# Patient Record
Sex: Female | Born: 1980 | Race: White | Hispanic: No | Marital: Married | Smoking: Never smoker
Health system: Southern US, Community
[De-identification: ages and names within clinical notes are randomized; demographics above are authoritative.]

## PROBLEM LIST (undated history)

## (undated) ENCOUNTER — Inpatient Hospital Stay (HOSPITAL_COMMUNITY): Payer: Self-pay

## (undated) DIAGNOSIS — D649 Anemia, unspecified: Secondary | ICD-10-CM

## (undated) DIAGNOSIS — O039 Complete or unspecified spontaneous abortion without complication: Secondary | ICD-10-CM

## (undated) DIAGNOSIS — E063 Autoimmune thyroiditis: Secondary | ICD-10-CM

## (undated) DIAGNOSIS — E039 Hypothyroidism, unspecified: Secondary | ICD-10-CM

## (undated) HISTORY — PX: ANKLE SURGERY: SHX546

## (undated) HISTORY — PX: TONSILLECTOMY: SUR1361

## (undated) HISTORY — PX: EYE SURGERY: SHX253

## (undated) HISTORY — PX: WISDOM TOOTH EXTRACTION: SHX21

---
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
## 2003-07-17 ENCOUNTER — Other Ambulatory Visit: Admission: RE | Admit: 2003-07-17 | Discharge: 2003-07-17 | Payer: Self-pay | Admitting: Obstetrics and Gynecology

## 2004-08-05 ENCOUNTER — Other Ambulatory Visit: Admission: RE | Admit: 2004-08-05 | Discharge: 2004-08-05 | Payer: Self-pay | Admitting: Obstetrics and Gynecology

## 2004-08-06 ENCOUNTER — Other Ambulatory Visit: Admission: RE | Admit: 2004-08-06 | Discharge: 2004-08-06 | Payer: Self-pay | Admitting: Obstetrics and Gynecology

## 2011-08-04 ENCOUNTER — Encounter (HOSPITAL_COMMUNITY): Payer: Self-pay

## 2011-08-04 ENCOUNTER — Ambulatory Visit (HOSPITAL_COMMUNITY)
Admission: RE | Admit: 2011-08-04 | Discharge: 2011-08-04 | Disposition: A | Discharge status: Home / Self Care | Payer: PRIVATE HEALTH INSURANCE | Source: Ambulatory Visit | Attending: Obstetrics and Gynecology | Admitting: Obstetrics and Gynecology

## 2011-08-04 NOTE — Progress Notes (Addendum)
Infant Lactation Consultation Outpatient Visit Note  SLOW WEIGHT GAIN  Patient Name: Bonnie Fisher  Infant- Bonnie Fisher Date of Birth: 05/06/1981  DOB- [birth year]  16 day old infant Birth Weight:  7 lbs. 2 oz  todays weight - 6 lbs. 11.7 oz Gestational Age at Delivery: Gestational Age: <None> 38 5/[redacted] weeks gestation Type of Delivery: Cesarean Section  Mom delivered at [facility] days ago, her second child.  Bonnie Fisher breast fed her first child (69 months old) for 6 months, until she returned to work and her supply dropped.  She states her baby grew well, no problems.  Bonnie Fisher lost 1 lb (16 oz) by the 5 th day of life.  Mom reports milk in, but nipples sore.  She said she fed every 3 hrs, waking baby as needed around the clock.  Bonnie Fisher fed for 30 mins or more.  Weight was checked 2 days later, and baby had gained 5.5 oz.  She fed on demand, continuing with the sore, blistered, cracked nipples, and a week later on 08/02/11, baby had gained .5 oz (6-8).  Pediatrician had her pump and bottle feed.  With her pump, she would obtain 1 1/2 -2 oz each pumping.  Yesterday she pumped every other feeding, and breast fed the other time.  Today, on assessment it is noted that baby latches too shallow on the breast.  Mom has baby resting in her lap, with little to no support of breast.  Baby flutter sucking, very little swallowing heard.  Assisted Mom to position baby more level of nipple, and support of breast, using breast compression to increase sucking and swallowing.  Baby fed for 20 mins with milk transfer at 16 ml.   After discussing options on how to increase Bonnie Fisher's weight, and support her milk supply, I initiated an SNS at the breast.  Baby was able to latch well, with assistance, and feed with rhythmic sucking and swallowing.  Bonnie Fisher reports no discomfort with this feeding.  Baby took entire 30 ml formula in the SNS plus more from Mom.  (baby had stooled and LC forgot to reweigh :-()  Mom very  pleased with plan.  Pump strength checked, and not up to proper limits, decision made to purchase a PIS pump (Free Style)  Breastfeeding History Frequency of Breastfeeding: every 3 hrs Length of Feeding: 30 mins Voids: 6-8/day Stools: 4-6 yellow seedy/day  Supplementing / Method: Pumping:  Type of Pump:  Medela PIS   Frequency:  Every 3 hrs Tuesday 08/02/11  Volume:  1 oz per pumping     Plan to feed baby at breast using SNS on second breast with 30 ml formula+/or EBM.  Mom to pump both breasts 15 mins.  If SNS not used at feeding, she is to follow breast feeding with 45 ml formula+/or EBM.  If she is too tired to breast feed, she can pump 20 mins both breasts, and bottle feed (slow flow) 60 ml EBM+/formula.  Return to Lactation Office on Monday, February 18th at 10:30am for another feeding assessment.      :           Bonnie Fisher 08/04/2011, 5:27 PM

## 2011-08-05 ENCOUNTER — Encounter (HOSPITAL_COMMUNITY): Payer: Self-pay

## 2011-08-08 ENCOUNTER — Ambulatory Visit (HOSPITAL_COMMUNITY)
Admission: RE | Admit: 2011-08-08 | Discharge: 2011-08-08 | Disposition: A | Discharge status: Home / Self Care | Payer: PRIVATE HEALTH INSURANCE | Source: Ambulatory Visit | Attending: Obstetrics and Gynecology | Admitting: Obstetrics and Gynecology

## 2011-08-09 NOTE — Progress Notes (Signed)
Adult Lactation Consultation Outpatient Visit Note  Patient Name: Bonnie Fisher       BABY: Luiz Ochoa Date of Birth: June 25, 1980                 DOB:  [birth year]   BIRTH WEIGHT: 7-2  WEIGHT TODAY 6-15.2 Gestational Age at Delivery: 38 5/7 Type of Delivery: C/S  Breastfeeding History: Frequency of Breastfeeding: EVERY 3 HOURS Length of Feeding: 15 MINUTES EACH BREAST Voids: 8/24 HOURS Stools: 8/24 HOURS  Supplementing / Method:Bottle/30-45 mls EBM every 3 hours after breastfeeding Pumping:  Type of Pump:PUMP IN STYLE   Frequency:EVERY 3 HOURS AFTER BREASTFEEDING  Volume:  30-45 MLS  Comments:    Consultation Evaluation:Mom is here for follow up appointment from 08/04/11 for slow weight gain and low milk supply.  She states she was unable to get SNS to work well at home so pumping after feeds and supplementing with bottle.  She states nipples are no longer sore and she feels like the baby is latching and feeding better.  Baby has gained 3.5 oz/3 days.  Observed mom latch baby without difficulty and latch deep into baby's mouth.  Baby nurses without much vigor and not a lot of audible swallows.  Mom reminded of good breast compression/massage to increase milk intake.  Total milk transfer after both breasts 36 mls.  Discussed plan with mother to continue pumping every three hours after feeds and supplementing with 30-22mls until milk supply has increased and baby's ability to transfer more milk has improved.  Information given on fenugreek and moringa for mom to consider to assist with milk supply.  Outpatient appointment scheduled for 1 week to assess weight and feeding.  Initial Feeding Assessment:RIGHT BREAST 15 MINUTES Pre-feed ZOXWRU:0454 Post-feed UJWJXB:1478 Amount Transferred:20 MLS Comments:  Additional Feeding Assessment:LEFT BREAST 15 MINUTES Pre-feed GNFAOZ:3086 Post-feed Weight:3190 Amount Transferred:16 MLS Comments:  Additional Feeding Assessment: Pre-feed  Weight: Post-feed Weight: Amount Transferred: Comments:  Total Breast milk Transferred this Visit: 36 MLS Total Supplement Given: 60 MLS EBM PER BOTTLE  Additional Interventions:   Follow-Up  Lactation appointment 08/15/11      Hansel Feinstein 08/09/2011, 3:44 PM

## 2011-08-15 ENCOUNTER — Ambulatory Visit (HOSPITAL_COMMUNITY)
Admission: RE | Admit: 2011-08-15 | Discharge: 2011-08-15 | Disposition: A | Discharge status: Home / Self Care | Payer: PRIVATE HEALTH INSURANCE | Source: Ambulatory Visit | Attending: Obstetrics and Gynecology | Admitting: Obstetrics and Gynecology

## 2011-08-15 NOTE — Progress Notes (Signed)
Adult Lactation Consultation Outpatient Visit Note  Patient Name: Bonnie Fisher                      Baby girl [name], DOB [birth year] 23 1/2 weeks old, Birth Weight 7 lb. 2 oz Date of Birth: [birth year]                                 Weight on [facility] at [facility] 08/10/11 was 7lb 1 oz.  Gestational Age at Delivery: Unknown Type of Delivery: c/s  Breastfeeding History: Frequency of Breastfeeding: every 3 hours Length of Feeding: 15 minutes Voids: 6-8/day Stools: was 4-6/day, past 2 days 1/day, yellow in color  Supplementing / Method: Pumping:  Type of Pump:  Medela Free Style   Frequency:  After each feeding, every 3 hours for 15 minutes  Volume:  Was getting 1 oz., now getting 1/2 to 1 oz. But mom feels baby is nursing better and emptying breast better. More active at the breast.  Comments: Follow up visit from last Monday, 08/08/11 for feeding assessment and weight check. Weight on 08/08/11 was 6 lb. 15 oz.  [name] had not regained to equal her birth weight. Mom is intermittently supplementing with 1/2 oz to 1 oz EBM. She reports she gives the baby back any amount of EBM she has pumped after the previous feeding. [name]'s weight today is 7lb 6 oz. Which indicates she has gained approx. An ounce per day. Mom reports she started Fenugreek on Saturday, 08/13/11 to support her milk supply.   Consultation Evaluation: [name] latched well to mom's left breast, assisted to bring bottom lip down. Lots of swallows audible with the feeding. [name] latched for 5 minutes to right breast with good swallows audible, then fell asleep. Very good feeding this time.    Initial Feeding Assessment: Pre-feed Weight:      7lb 6 oz.  3392gm. Post-feed Weight:     7 lb. 10.9 oz    3484 gm Amount Transferred:   92 ml  Breastfeeding for 20 minutes on left breast Comments:  Additional Feeding Assessment: Pre-feed Weight:     7 lb. 10.9 oz  3484 gm Post-feed Weight:    7 lb. 11.8 oz.  3510 gm Amount Transferred:   26 ml Breastfeeding for 5 minutes on right breast Comments:  Additional Feeding Assessment: Pre-feed Weight: Post-feed Weight: Amount Transferred: Comments:  Total Breast milk Transferred this Visit: 118 ml approx 4 oz. Total Supplement Given:   Additional Interventions: No further interventions needed. Mom was very pleased with weight gain and milk transfer at this feeding. She will continue to Breastfeed every 3 hours. She will let [name] wake her at night. She will post pump if needed or if [name] only takes one breast. Advised to supplement with EBM if [name] is lazy at the breast or does not feed well. Advised of support group. Discussed pumping to return to work.  Follow-Up  Prn    Alfred Levins 08/15/2011, 10:49 AM

## 2012-06-30 ENCOUNTER — Encounter (HOSPITAL_COMMUNITY): Payer: Self-pay | Admitting: *Deleted

## 2012-06-30 ENCOUNTER — Inpatient Hospital Stay (HOSPITAL_COMMUNITY)
Admission: AD | Admit: 2012-06-30 | Discharge: 2012-06-30 | Disposition: A | Discharge status: Home / Self Care | Payer: PRIVATE HEALTH INSURANCE | Source: Ambulatory Visit | Attending: Obstetrics & Gynecology | Admitting: Obstetrics & Gynecology

## 2012-06-30 ENCOUNTER — Inpatient Hospital Stay (HOSPITAL_COMMUNITY): Payer: PRIVATE HEALTH INSURANCE

## 2012-06-30 DIAGNOSIS — O209 Hemorrhage in early pregnancy, unspecified: Secondary | ICD-10-CM

## 2012-06-30 HISTORY — DX: Autoimmune thyroiditis: E06.3

## 2012-06-30 LAB — CBC
HCT: 35 % — ABNORMAL LOW (ref 36.0–46.0)
Hemoglobin: 11.7 g/dL — ABNORMAL LOW (ref 12.0–15.0)
MCH: 25.9 pg — ABNORMAL LOW (ref 26.0–34.0)
MCHC: 33.4 g/dL (ref 30.0–36.0)
MCV: 77.6 fL — ABNORMAL LOW (ref 78.0–100.0)
RBC: 4.51 MIL/uL (ref 3.87–5.11)

## 2012-06-30 LAB — URINALYSIS, ROUTINE W REFLEX MICROSCOPIC
Bilirubin Urine: NEGATIVE
Glucose, UA: NEGATIVE mg/dL
Ketones, ur: NEGATIVE mg/dL
Leukocytes, UA: NEGATIVE
Protein, ur: NEGATIVE mg/dL

## 2012-06-30 LAB — ABO/RH: ABO/RH(D): O POS

## 2012-06-30 LAB — URINE MICROSCOPIC-ADD ON

## 2012-06-30 NOTE — MAU Provider Note (Signed)
Attestation of Attending Supervision of Advanced Practitioner (PA/CNM/NP): Evaluation and management procedures were performed by the Advanced Practitioner under my supervision and collaboration.  I have reviewed the Advanced Practitioner's note and chart, and I agree with the management and plan.  Clella Mckeel, MD, FACOG Attending Obstetrician & Gynecologist Faculty Practice, [facility]  

## 2012-06-30 NOTE — MAU Provider Note (Signed)
Chief Complaint  Patient presents with  . Vaginal Bleeding    Subjective Bonnie Fisher 32 y.o.  G1P0 [redacted]w[redacted]d presents with onset today of first episode of small amount pink vaginal bleeding noted after wiping yesterday afternoon. She called her physician in [place] who reassured that that may be implantation spotting. Later yesterday evening she began to have heavier bleeding like a light period and has continued to have brown and red vaginal bleeding this morning. Minimal cramping. Not passing clots or tissue. No antecedent intercourse. Denies abnormal  vaginal discharge or irritation. No dysuria or hematuria.  Lives in [place], works [employer], plans [facility] in [place], [place]  Past Medical History  Diagnosis Date  . Hashimoto's disease      Objective   Filed Vitals:   06/30/12 1011  BP: 108/76  Pulse: 74  Temp: 98 F (36.7 C)  Resp: 18     Physical Exam General: WN/WD pleasant female in NAD  Abdomen: soft, NT Pelvic:External genitalia: normal; BUS neg             Spec: small amount dark red blood in vault, swabbed off and no active bleeding                      Cx nulliparous, no lesions, appears closed           Bimanual: Cx closed, long; no CMT                             Uterus anteverted, NT, retroverted                             Adnexae non tender, no masses   Lab Results Results for orders placed during the hospital encounter of 06/30/12 (from the past 24 hour(s))  URINALYSIS, ROUTINE W REFLEX MICROSCOPIC     Status: Abnormal   Collection Time   06/30/12 10:10 AM      Component Value Range   Color, Urine YELLOW  YELLOW   APPearance CLEAR  CLEAR   Specific Gravity, Urine 1.020  1.005 - 1.030   pH 8.0  5.0 - 8.0   Glucose, UA NEGATIVE  NEGATIVE mg/dL   Hgb urine dipstick LARGE (*) NEGATIVE   Bilirubin Urine NEGATIVE  NEGATIVE   Ketones, ur NEGATIVE  NEGATIVE mg/dL   Protein, ur NEGATIVE  NEGATIVE mg/dL   Urobilinogen, UA 0.2  0.0 - 1.0 mg/dL   Nitrite NEGATIVE  NEGATIVE     Leukocytes, UA NEGATIVE  NEGATIVE  URINE MICROSCOPIC-ADD ON     Status: Abnormal   Collection Time   06/30/12 10:10 AM      Component Value Range   Squamous Epithelial / LPF FEW (*) RARE   WBC, UA 3-6  <3 WBC/hpf   RBC / HPF 21-50  <3 RBC/hpf   Bacteria, UA FEW (*) RARE   Urine-Other MUCOUS PRESENT    POCT PREGNANCY, URINE     Status: Abnormal   Collection Time   06/30/12 10:24 AM      Component Value Range   Preg Test, Ur POSITIVE (*) NEGATIVE  WET PREP, GENITAL     Status: Abnormal   Collection Time   06/30/12 10:41 AM      Component Value Range   Yeast Wet Prep HPF POC NONE SEEN  NONE SEEN   Trich, Wet Prep NONE SEEN  NONE SEEN  Clue Cells Wet Prep HPF POC NONE SEEN  NONE SEEN   WBC, Wet Prep HPF POC MODERATE (*) NONE SEEN  HCG, QUANTITATIVE, PREGNANCY     Status: Abnormal   Collection Time   06/30/12 10:58 AM      Component Value Range   hCG, Beta Chain, Quant, S 836 (*) <5 mIU/mL  CBC     Status: Abnormal   Collection Time   06/30/12 10:59 AM      Component Value Range   WBC 5.8  4.0 - 10.5 K/uL   RBC 4.51  3.87 - 5.11 MIL/uL   Hemoglobin 11.7 (*) 12.0 - 15.0 g/dL   HCT 16.1 (*) 09.6 - 04.5 %   MCV 77.6 (*) 78.0 - 100.0 fL   MCH 25.9 (*) 26.0 - 34.0 pg   MCHC 33.4  30.0 - 36.0 g/dL   RDW 40.9  81.1 - 91.4 %   Platelets 252  150 - 400 K/uL  ABO/RH     Status: Normal   Collection Time   06/30/12 10:59 AM      Component Value Range   ABO/RH(D) O POS      Ultrasound US Ob Comp Less 14 Wks  06/30/2012  *RADIOLOGY REPORT*  Clinical Data: Vaginal bleeding.  Positive urine pregnancy test. Quantitative beta HCG 836.  OBSTETRIC <14 WK [place] AND TRANSVAGINAL OB US  Technique:  Both transabdominal and transvaginal ultrasound examinations were performed for complete evaluation of the gestation as well as the maternal uterus, adnexal regions, and pelvic cul-de-sac.  Transvaginal technique was performed to assess early pregnancy.  Comparison:  None.  Intrauterine gestational sac:   None visualized. Yolk sac: None. Embryo: None. Cardiac Activity: None. Heart Rate: Not applicable. bpm  MSD:  mm   w  d CRL:   mm   w   d             [place] EDC:  Maternal uterus/adnexae: The endometrium is mildly thickened but within normal limits for a woman of childbearing years, measuring 7 mm.  Tiny amount of fluid is present in the endometrium.  Left ovary demonstrates a small corpus luteum cyst.  Color flow to the ovaries is normal.  IMPRESSION: No intrauterine pregnancy identified.  Differential considerations include normal or abnormal early intrauterine pregnancy.  Ectopic cannot be excluded.  Consider follow-up 10-day ultrasound and correlation with serial quantitative beta HCGs.   Original Report Authenticated By: Andreas Newport, M.D.    US Ob Transvaginal  06/30/2012  *RADIOLOGY REPORT*  Clinical Data: Vaginal bleeding.  Positive urine pregnancy test. Quantitative beta HCG 836.  OBSTETRIC <14 WK [place] AND TRANSVAGINAL OB US  Technique:  Both transabdominal and transvaginal ultrasound examinations were performed for complete evaluation of the gestation as well as the maternal uterus, adnexal regions, and pelvic cul-de-sac.  Transvaginal technique was performed to assess early pregnancy.  Comparison:  None.  Intrauterine gestational sac:  None visualized. Yolk sac: None. Embryo: None. Cardiac Activity: None. Heart Rate: Not applicable. bpm  MSD:  mm   w  d CRL:   mm   w   d             [place] EDC:  Maternal uterus/adnexae: The endometrium is mildly thickened but within normal limits for a woman of childbearing years, measuring 7 mm.  Tiny amount of fluid is present in the endometrium.  Left ovary demonstrates a small corpus luteum cyst.  Color flow to the ovaries is normal.  IMPRESSION: No  intrauterine pregnancy identified.  Differential considerations include normal or abnormal early intrauterine pregnancy.  Ectopic cannot be excluded.  Consider follow-up 10-day ultrasound and correlation with serial  quantitative beta HCGs.   Original Report Authenticated By: Andreas Newport, M.D.      Assessment 32 yo primigravida at 7 wks by LMP  Early pregnancy bleeding, cannot R/O ectopic   Plan    GC/CT sent Likelihood of failed pregnancy discussed and possibility of very early IUP or ectopic reviewed with couple.  Discharge with AVS on Early Pregnancy Bleeding and Ectopic Pregnancy Followup with primary MD in [place] in 2 days  Matthan Sledge 06/30/2012 10:19 AM

## 2012-06-30 NOTE — MAU Note (Signed)
Pt reports she had some spotting yesterday.It got heavier last night filled up a pad may have had some clots. Now is back to spotting. Had a a little bit of back pain yesterday but no pain today.

## 2012-07-02 LAB — URINE CULTURE
Colony Count: 75000
Special Requests: NORMAL

## 2012-07-02 LAB — GC/CHLAMYDIA PROBE AMP: CT Probe RNA: NEGATIVE

## 2013-05-05 ENCOUNTER — Encounter (HOSPITAL_COMMUNITY): Payer: Self-pay | Admitting: *Deleted

## 2013-12-06 IMAGING — US US OB COMP LESS 14 WK
1 series · 14 of 28 positions shown · non-contrast
Comparison: None.

CLINICAL DATA: Vaginal bleeding.  Positive urine pregnancy test.
Quantitative beta HCG 836.

OBSTETRIC <14 WK US AND TRANSVAGINAL OB US
TECHNIQUE: Both transabdominal and transvaginal ultrasound
examinations were performed for complete evaluation of the
gestation as well as the maternal uterus, adnexal regions, and
pelvic cul-de-sac.  Transvaginal technique was performed to assess
early pregnancy.

[Series 1: us ob comp less 14 wks · 14 of 40 slices shown]
[im 2/40]
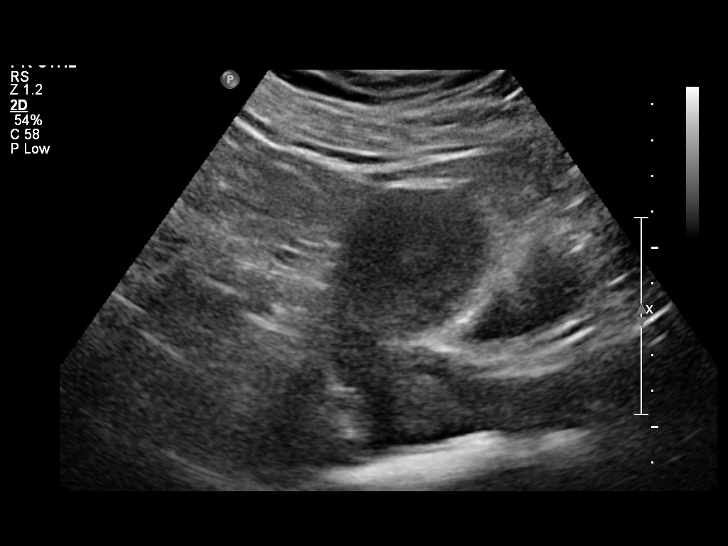
[im 5/40]
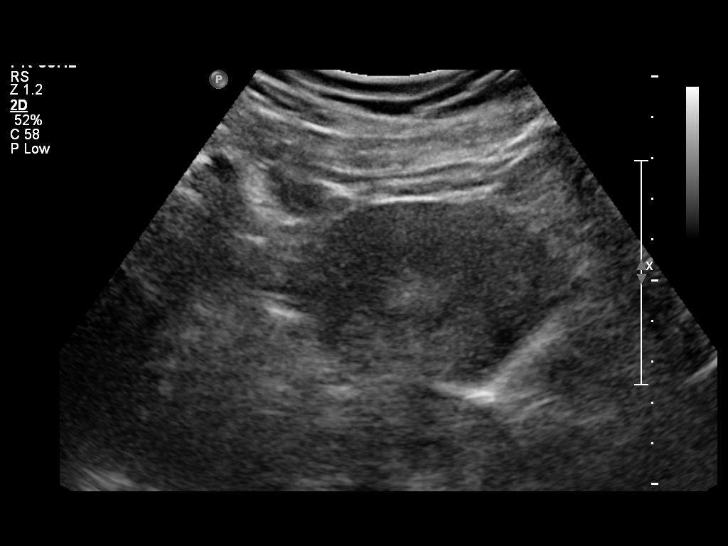
[im 8/40]
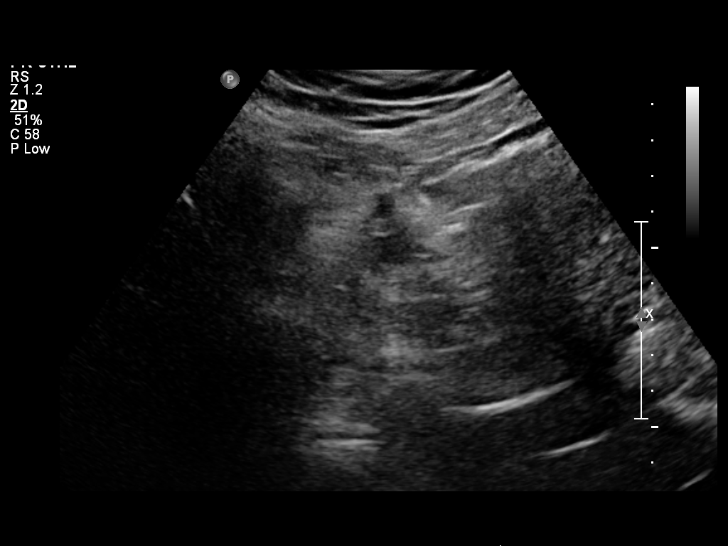
[im 11/40]
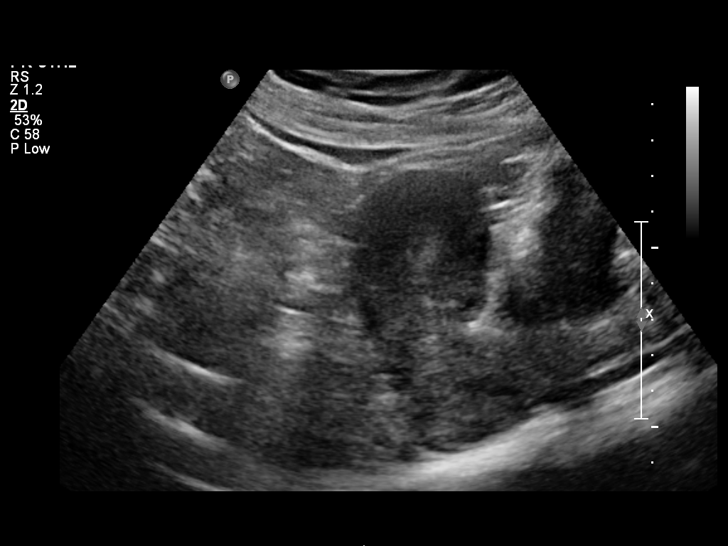
[im 14/40]
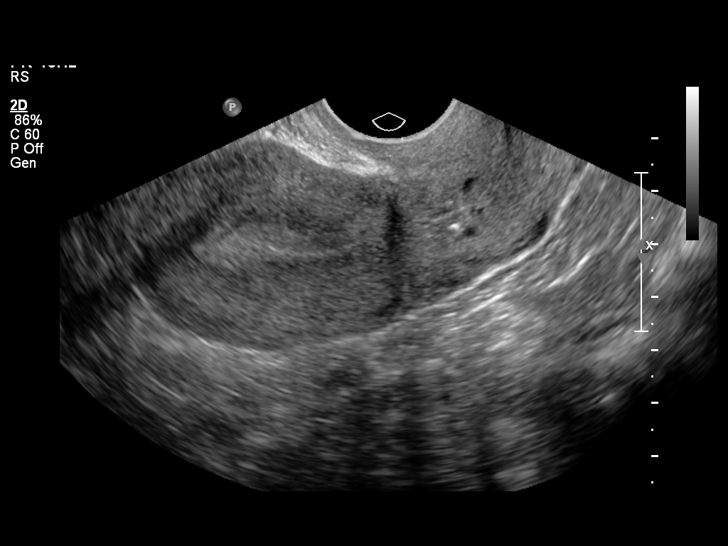
[im 16/40]
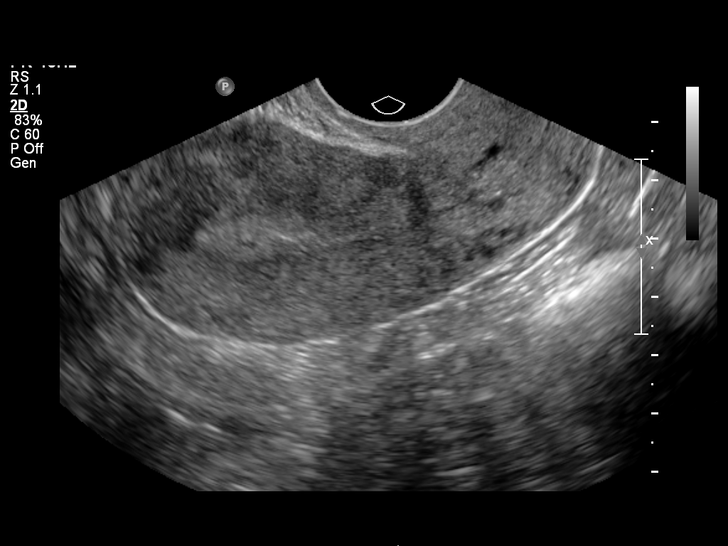
[im 19/40]
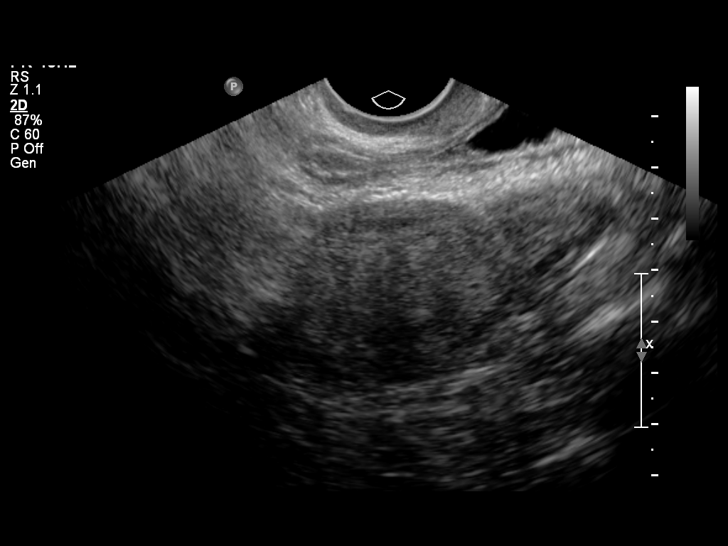
[im 22/40]
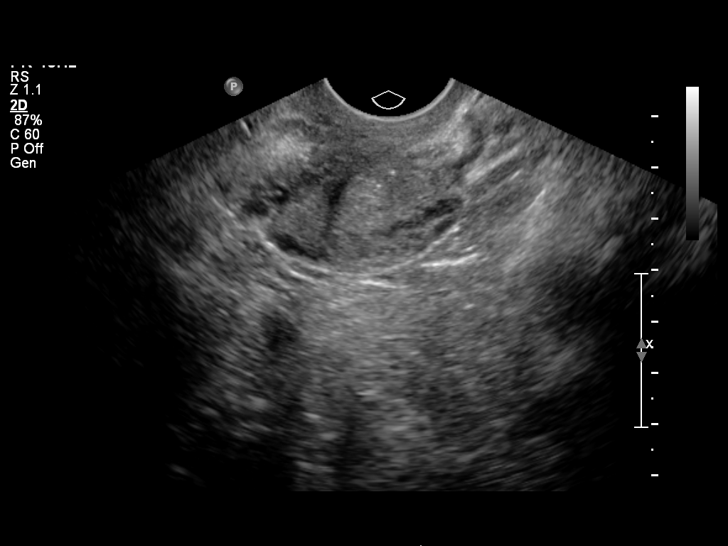
[im 25/40]
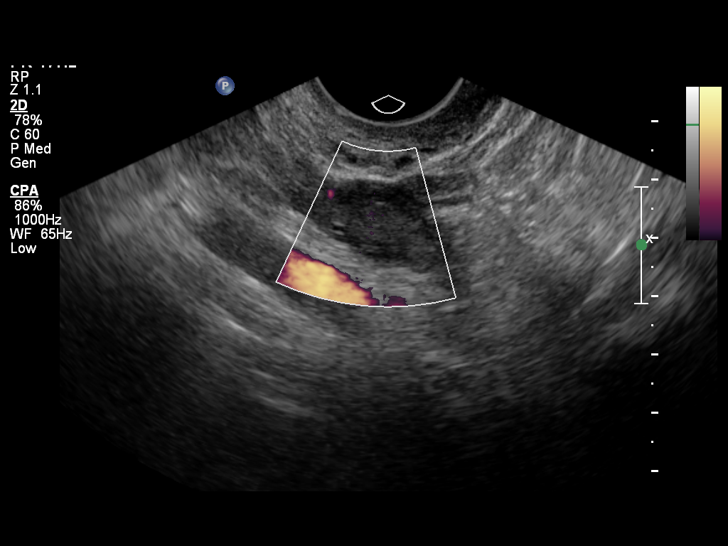
[im 28/40]
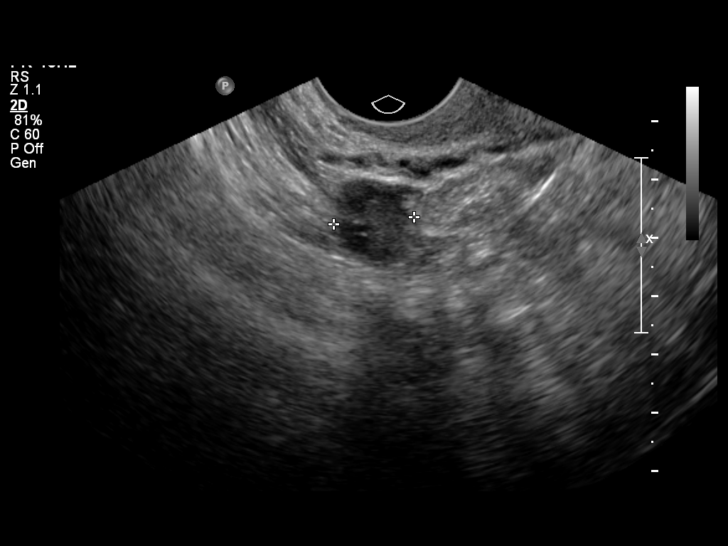
[im 31/40]
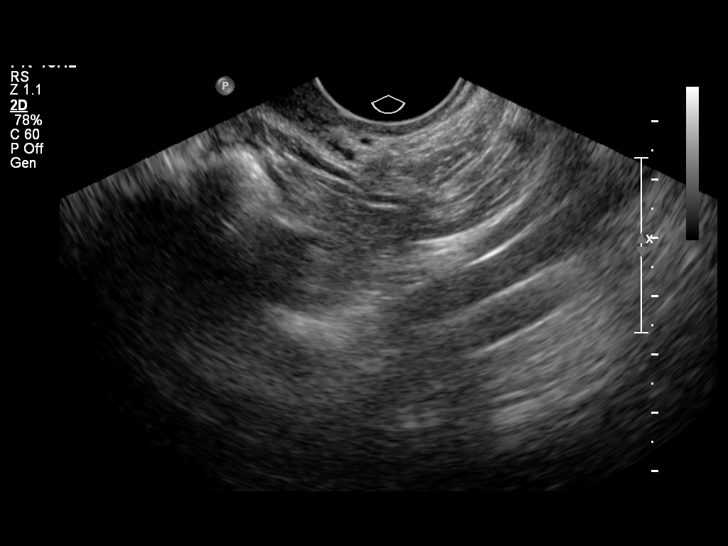
[im 34/40]
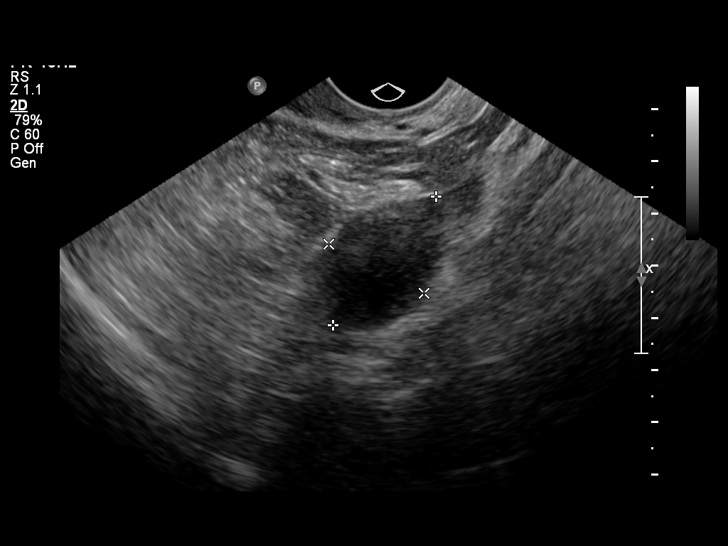
[im 37/40]
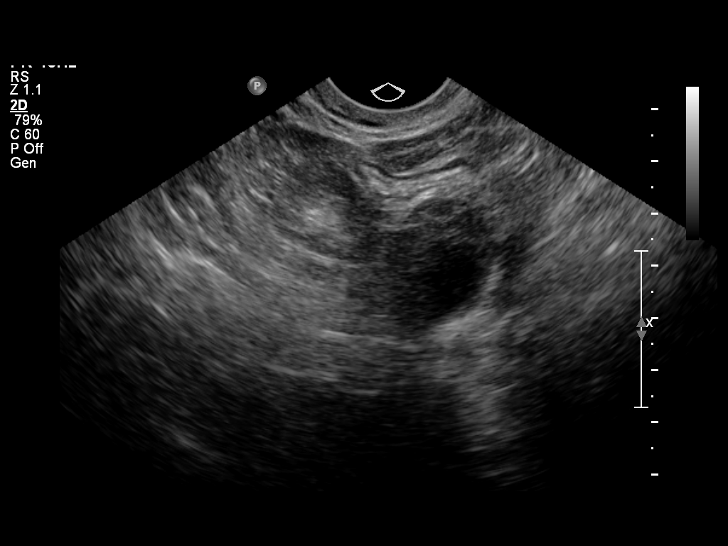
[im 40/40]
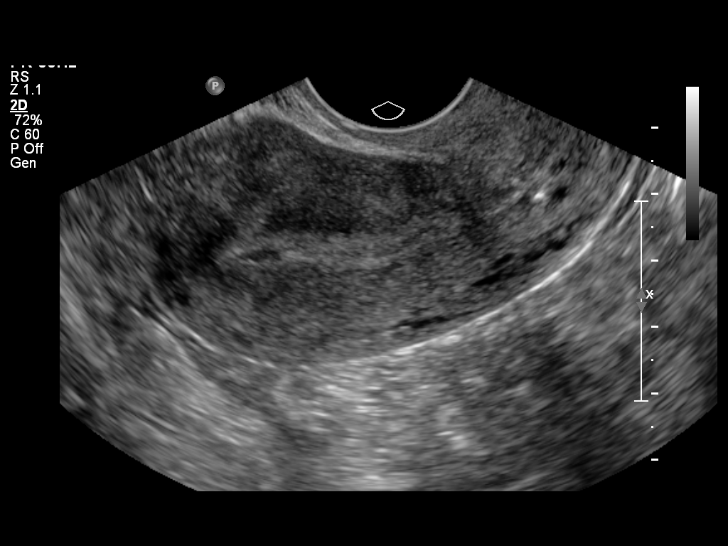

[14 of 28 positions shown; findings below may reference images not displayed]

Intrauterine gestational sac:  None visualized.
Yolk sac: None.
Embryo: None.
Cardiac Activity: None.
Heart Rate: Not applicable. bpm

MSD:  mm   w  d
CRL:   mm   w   d             US EDC:

Maternal uterus/adnexae:
The endometrium is mildly thickened but within normal limits for a
woman of childbearing years, measuring 7 mm.  Tiny amount of fluid
is present in the endometrium.  Left ovary demonstrates a small
corpus luteum cyst.  Color flow to the ovaries is normal.
IMPRESSION: No intrauterine pregnancy identified.  Differential considerations
include normal or abnormal early intrauterine pregnancy.  Ectopic
cannot be excluded.  Consider follow-up 10-day ultrasound and
correlation with serial quantitative beta HCGs.

## 2014-04-21 ENCOUNTER — Encounter (HOSPITAL_COMMUNITY): Payer: Self-pay | Admitting: *Deleted

## 2014-07-01 LAB — OB RESULTS CONSOLE HEPATITIS B SURFACE ANTIGEN: Hepatitis B Surface Ag: NEGATIVE

## 2014-07-01 LAB — OB RESULTS CONSOLE ABO/RH: RH Type: POSITIVE

## 2014-07-01 LAB — OB RESULTS CONSOLE ANTIBODY SCREEN: ANTIBODY SCREEN: NEGATIVE

## 2014-07-01 LAB — OB RESULTS CONSOLE HIV ANTIBODY (ROUTINE TESTING): HIV: NONREACTIVE

## 2014-10-10 LAB — OB RESULTS CONSOLE RPR: RPR: NONREACTIVE

## 2014-11-25 ENCOUNTER — Other Ambulatory Visit: Payer: Self-pay | Admitting: Obstetrics and Gynecology

## 2014-12-11 ENCOUNTER — Other Ambulatory Visit: Payer: Self-pay | Admitting: Obstetrics and Gynecology

## 2014-12-25 ENCOUNTER — Encounter (HOSPITAL_COMMUNITY): Payer: Self-pay

## 2014-12-25 NOTE — Patient Instructions (Addendum)
   Your procedure is scheduled on: MONDAY, JULY 11  Enter through the [facility]tr[facility] of [facility] at: 7:30 AM Pick up the phone at the desk and dial [phone] and inform us of your arrival.  Please call this number if you have any problems the morning of surgery: [phone]  Remember: Do not eat or drink after midnight: Sunday Take these medicines the morning of surgery with a SIP OF WATER: synthroid  Do not wear jewelry, make-up, or FINGER nail polish No metal in your hair or on your body. Do not wear lotions, powders, perfumes.  You may wear deodorant.  Do not bring valuables to the hospital. Contacts, dentures or bridgework may not be worn into surgery.  Leave suitcase in the car. After Surgery it may be brought to your room. For patients being admitted to the hospital, checkout time is 11:00am the day of discharge.  Home with husband Bucky cell [phone]

## 2014-12-26 ENCOUNTER — Encounter (HOSPITAL_COMMUNITY)
Admission: RE | Admit: 2014-12-26 | Discharge: 2014-12-26 | Disposition: A | Discharge status: Home / Self Care | Payer: BLUE CROSS/BLUE SHIELD | Source: Ambulatory Visit | Attending: Obstetrics and Gynecology | Admitting: Obstetrics and Gynecology

## 2014-12-26 ENCOUNTER — Encounter (HOSPITAL_COMMUNITY): Payer: Self-pay

## 2014-12-26 HISTORY — DX: Anemia, unspecified: D64.9

## 2014-12-26 HISTORY — DX: Complete or unspecified spontaneous abortion without complication: O03.9

## 2014-12-26 HISTORY — DX: Hypothyroidism, unspecified: E03.9

## 2014-12-26 LAB — CBC
HCT: 30.6 % — ABNORMAL LOW (ref 36.0–46.0)
Hemoglobin: 9.6 g/dL — ABNORMAL LOW (ref 12.0–15.0)
MCH: 23.8 pg — AB (ref 26.0–34.0)
MCHC: 31.4 g/dL (ref 30.0–36.0)
MCV: 75.7 fL — ABNORMAL LOW (ref 78.0–100.0)
PLATELETS: 241 10*3/uL (ref 150–400)
RBC: 4.04 MIL/uL (ref 3.87–5.11)
RDW: 14.7 % (ref 11.5–15.5)
WBC: 9.8 10*3/uL (ref 4.0–10.5)

## 2014-12-26 LAB — RPR: RPR Ser Ql: NONREACTIVE

## 2014-12-28 NOTE — H&P (Signed)
Bonnie Fisher is a 34 y.o. female presenting for rpt csection and TL.  Maternal Medical History:  Contractions: Onset was less than 1 hour ago.   Frequency: rare.   Perceived severity is mild.    Fetal activity: Perceived fetal activity is normal.   Last perceived fetal movement was within the past hour.    Prenatal complications: no prenatal complications Prenatal Complications - Diabetes: none.    OB History    Gravida Para Term Preterm AB TAB SAB Ectopic Multiple Living   5 3 3  0 1 0 1 0 0 3     Past Medical History  Diagnosis Date  . Hashimoto's disease     thyroid -autoimmune disease  . SAB (spontaneous abortion)     x 1 - no surgery required  . Hypothyroidism   . Anemia    Past Surgical History  Procedure Laterality Date  . Cesarean section      x 3  . Ankle surgery      left  . Tonsillectomy    . Wisdom tooth extraction    . Eye surgery      bilateral - lasik   Family History: family history includes Cancer in her father; Hyperlipidemia in her mother. Social History:  reports that she has never smoked. She has never used smokeless tobacco. She reports that she does not drink alcohol or use illicit drugs.   Prenatal Transfer Tool  Maternal Diabetes: No Genetic Screening: Normal Maternal Ultrasounds/Referrals: Normal Fetal Ultrasounds or other Referrals:  None Maternal Substance Abuse:  No Significant Maternal Medications:  None Significant Maternal Lab Results:  None Other Comments:  None  Review of Systems  Constitutional: Negative.   Gastrointestinal: Negative.   Genitourinary: Negative.   Neurological: Negative.   All other systems reviewed and are negative.     unknown if currently breastfeeding. Maternal Exam:  Uterine Assessment: Contraction strength is mild.  Contraction frequency is rare.   Abdomen: Patient reports no abdominal tenderness. Surgical scars: low transverse.   Fetal presentation: vertex  Introitus: Normal vulva. Normal  vagina.  Ferning test: not done.  Nitrazine test: not done. Amniotic fluid character: not assessed.  Pelvis: of concern for delivery.   Cervix: Cervix evaluated by digital exam.     Physical Exam  Nursing note and vitals reviewed. Constitutional: She is oriented to person, place, and time. She appears well-developed and well-nourished.  HENT:  Head: Normocephalic and atraumatic.  Neck: Normal range of motion. Neck supple.  Cardiovascular: Normal rate, regular rhythm and normal heart sounds.   Respiratory: Effort normal and breath sounds normal.  GI: Soft. Bowel sounds are normal.  Genitourinary: Vagina normal and uterus normal.  Musculoskeletal: Normal range of motion.  Neurological: She is alert and oriented to person, place, and time. She has normal reflexes.  Skin: Skin is dry.  Psychiatric: She has a normal mood and affect.    Prenatal labs: ABO, Rh: --/--/O POS (07/08 0845) Antibody: NEG (07/08 0845) Rubella:   RPR: Non Reactive (07/08 0845)  HBsAg: Negative (01/12 0000)  HIV: Non-reactive (01/12 0000)  GBS:     Assessment/Plan: Previous csection x 3 For rpt csection and TL Risks discussed. Consent done.   Maitri Schnoebelen J 12/28/2014, 5:32 PM

## 2014-12-28 NOTE — Anesthesia Preprocedure Evaluation (Addendum)
Anesthesia Evaluation  Patient identified by MRN, date of birth, ID band Patient awake    Reviewed: Allergy & Precautions, NPO status , Patient's Chart, lab work & pertinent test results  History of Anesthesia Complications Negative for: history of anesthetic complications  Airway Mallampati: II  TM Distance: >3 FB Neck ROM: Full    Dental no notable dental hx. (+) Dental Advisory Given   Pulmonary neg pulmonary ROS,  breath sounds clear to auscultation  Pulmonary exam normal       Cardiovascular negative cardio ROS Normal cardiovascular examRhythm:Regular Rate:Normal     Neuro/Psych negative neurological ROS  negative psych ROS   GI/Hepatic negative GI ROS, Neg liver ROS,   Endo/Other  Hypothyroidism obesity  Renal/GU negative Renal ROS  negative genitourinary   Musculoskeletal negative musculoskeletal ROS (+)   Abdominal   Peds negative pediatric ROS (+)  Hematology  (+) anemia ,   Anesthesia Other Findings   Reproductive/Obstetrics (+) Pregnancy                             Anesthesia Physical Anesthesia Plan  ASA: II  Anesthesia Plan: Spinal   Post-op Pain Management:    Induction:   Airway Management Planned:   Additional Equipment:   Intra-op Plan:   Post-operative Plan:   Informed Consent: I have reviewed the patients History and Physical, chart, labs and discussed the procedure including the risks, benefits and alternatives for the proposed anesthesia with the patient or authorized representative who has indicated his/her understanding and acceptance.   Dental advisory given  Plan Discussed with: CRNA  Anesthesia Plan Comments:         Anesthesia Quick Evaluation

## 2014-12-29 ENCOUNTER — Inpatient Hospital Stay (HOSPITAL_COMMUNITY): Payer: BLUE CROSS/BLUE SHIELD | Admitting: Anesthesiology

## 2014-12-29 ENCOUNTER — Inpatient Hospital Stay (HOSPITAL_COMMUNITY)
Admission: RE | Admit: 2014-12-29 | Discharge: 2014-12-31 | DRG: 765 | Disposition: A | Discharge status: Home / Self Care | Payer: BLUE CROSS/BLUE SHIELD | Source: Ambulatory Visit | Attending: Obstetrics and Gynecology | Admitting: Obstetrics and Gynecology

## 2014-12-29 ENCOUNTER — Encounter (HOSPITAL_COMMUNITY)
Admission: RE | Disposition: A | Discharge status: Home / Self Care | Payer: Self-pay | Source: Ambulatory Visit | Attending: Obstetrics and Gynecology

## 2014-12-29 ENCOUNTER — Encounter (HOSPITAL_COMMUNITY): Payer: Self-pay

## 2014-12-29 DIAGNOSIS — O99214 Obesity complicating childbirth: Secondary | ICD-10-CM | POA: Diagnosis present

## 2014-12-29 DIAGNOSIS — O09299 Supervision of pregnancy with other poor reproductive or obstetric history, unspecified trimester: Secondary | ICD-10-CM | POA: Diagnosis present

## 2014-12-29 DIAGNOSIS — Z302 Encounter for sterilization: Secondary | ICD-10-CM

## 2014-12-29 DIAGNOSIS — O99284 Endocrine, nutritional and metabolic diseases complicating childbirth: Secondary | ICD-10-CM | POA: Diagnosis present

## 2014-12-29 DIAGNOSIS — Z283 Underimmunization status: Secondary | ICD-10-CM

## 2014-12-29 DIAGNOSIS — E063 Autoimmune thyroiditis: Secondary | ICD-10-CM | POA: Diagnosis present

## 2014-12-29 DIAGNOSIS — O09523 Supervision of elderly multigravida, third trimester: Secondary | ICD-10-CM | POA: Diagnosis not present

## 2014-12-29 DIAGNOSIS — O99892 Other specified diseases and conditions complicating childbirth: Secondary | ICD-10-CM

## 2014-12-29 DIAGNOSIS — O3421 Maternal care for scar from previous cesarean delivery: Secondary | ICD-10-CM | POA: Diagnosis present

## 2014-12-29 DIAGNOSIS — D62 Acute posthemorrhagic anemia: Secondary | ICD-10-CM | POA: Diagnosis present

## 2014-12-29 DIAGNOSIS — O9902 Anemia complicating childbirth: Secondary | ICD-10-CM | POA: Diagnosis present

## 2014-12-29 DIAGNOSIS — D509 Iron deficiency anemia, unspecified: Secondary | ICD-10-CM | POA: Diagnosis present

## 2014-12-29 DIAGNOSIS — O99820 Streptococcus B carrier state complicating pregnancy: Secondary | ICD-10-CM | POA: Diagnosis present

## 2014-12-29 DIAGNOSIS — O99019 Anemia complicating pregnancy, unspecified trimester: Secondary | ICD-10-CM

## 2014-12-29 DIAGNOSIS — Z3A39 39 weeks gestation of pregnancy: Secondary | ICD-10-CM

## 2014-12-29 DIAGNOSIS — E039 Hypothyroidism, unspecified: Secondary | ICD-10-CM | POA: Diagnosis present

## 2014-12-29 DIAGNOSIS — O9989 Other specified diseases and conditions complicating pregnancy, childbirth and the puerperium: Secondary | ICD-10-CM

## 2014-12-29 LAB — PREPARE RBC (CROSSMATCH)

## 2014-12-29 SURGERY — Surgical Case
Anesthesia: Spinal | Laterality: Bilateral

## 2014-12-29 MED ORDER — SCOPOLAMINE 1 MG/3DAYS TD PT72
1.0000 | MEDICATED_PATCH | Freq: Once | TRANSDERMAL | Status: DC
  Filled 2014-12-29: qty 1

## 2014-12-29 MED ORDER — LACTATED RINGERS IV SOLN
40.0000 [IU] | INTRAVENOUS | Status: DC | PRN
  Administered 2014-12-29: 40 [IU] via INTRAVENOUS

## 2014-12-29 MED ORDER — OXYTOCIN 10 UNIT/ML IJ SOLN
INTRAMUSCULAR | Status: AC
  Filled 2014-12-29: qty 4

## 2014-12-29 MED ORDER — PHENYLEPHRINE 8 MG IN D5W 100 ML (0.08MG/ML) PREMIX OPTIME
INJECTION | INTRAVENOUS | Status: DC | PRN
  Administered 2014-12-29: 60 ug/min via INTRAVENOUS

## 2014-12-29 MED ORDER — LACTATED RINGERS IV SOLN
Freq: Once | INTRAVENOUS | Status: AC
  Administered 2014-12-29: 08:00:00 via INTRAVENOUS

## 2014-12-29 MED ORDER — MORPHINE SULFATE 0.5 MG/ML IJ SOLN
INTRAMUSCULAR | Status: AC
  Filled 2014-12-29: qty 10

## 2014-12-29 MED ORDER — SCOPOLAMINE 1 MG/3DAYS TD PT72
1.0000 | MEDICATED_PATCH | Freq: Once | TRANSDERMAL | Status: DC
  Administered 2014-12-29: 1.5 mg via TRANSDERMAL

## 2014-12-29 MED ORDER — DIPHENHYDRAMINE HCL 25 MG PO CAPS: 25.0000 mg | ORAL_CAPSULE | ORAL | Status: DC | PRN

## 2014-12-29 MED ORDER — BUPIVACAINE HCL (PF) 0.25 % IJ SOLN
INTRAMUSCULAR | Status: DC | PRN
  Administered 2014-12-29: 20 mL

## 2014-12-29 MED ORDER — DEXAMETHASONE SODIUM PHOSPHATE 4 MG/ML IJ SOLN
INTRAMUSCULAR | Status: AC
  Filled 2014-12-29: qty 1

## 2014-12-29 MED ORDER — WITCH HAZEL-GLYCERIN EX PADS: 1.0000 "application " | MEDICATED_PAD | CUTANEOUS | Status: DC | PRN

## 2014-12-29 MED ORDER — OXYTOCIN 40 UNITS IN LACTATED RINGERS INFUSION - SIMPLE MED: 62.5000 mL/h | INTRAVENOUS | Status: AC

## 2014-12-29 MED ORDER — SIMETHICONE 80 MG PO CHEW
80.0000 mg | CHEWABLE_TABLET | ORAL | Status: DC
Start: 2014-12-30 — End: 2014-12-31
  Administered 2014-12-29 – 2014-12-30 (×2): 80 mg via ORAL
  Filled 2014-12-29 (×2): qty 1

## 2014-12-29 MED ORDER — NALOXONE HCL 0.4 MG/ML IJ SOLN: 0.4000 mg | INTRAMUSCULAR | Status: DC | PRN

## 2014-12-29 MED ORDER — IBUPROFEN 600 MG PO TABS
600.0000 mg | ORAL_TABLET | Freq: Four times a day (QID) | ORAL | Status: DC
  Administered 2014-12-29 – 2014-12-31 (×7): 600 mg via ORAL
  Filled 2014-12-29 (×7): qty 1

## 2014-12-29 MED ORDER — ONDANSETRON HCL 4 MG/2ML IJ SOLN
INTRAMUSCULAR | Status: DC | PRN
  Administered 2014-12-29: 4 mg via INTRAVENOUS

## 2014-12-29 MED ORDER — LANOLIN HYDROUS EX OINT: 1.0000 "application " | TOPICAL_OINTMENT | CUTANEOUS | Status: DC | PRN

## 2014-12-29 MED ORDER — LACTATED RINGERS IV SOLN
INTRAVENOUS | Status: DC
  Administered 2014-12-29 (×2): via INTRAVENOUS

## 2014-12-29 MED ORDER — PRENATAL MULTIVITAMIN CH
1.0000 | ORAL_TABLET | Freq: Every day | ORAL | Status: DC
  Administered 2014-12-30: 1 via ORAL
  Filled 2014-12-29: qty 1

## 2014-12-29 MED ORDER — FENTANYL CITRATE (PF) 100 MCG/2ML IJ SOLN
INTRAMUSCULAR | Status: AC
  Filled 2014-12-29: qty 2

## 2014-12-29 MED ORDER — FENTANYL CITRATE (PF) 100 MCG/2ML IJ SOLN: 25.0000 ug | INTRAMUSCULAR | Status: DC | PRN

## 2014-12-29 MED ORDER — NALBUPHINE HCL 10 MG/ML IJ SOLN
INTRAMUSCULAR | Status: AC
  Administered 2014-12-29: 5 mg via SUBCUTANEOUS
  Filled 2014-12-29: qty 1

## 2014-12-29 MED ORDER — FENTANYL CITRATE (PF) 100 MCG/2ML IJ SOLN
INTRAMUSCULAR | Status: DC | PRN
  Administered 2014-12-29: 10 ug via INTRAVENOUS

## 2014-12-29 MED ORDER — SIMETHICONE 80 MG PO CHEW: 80.0000 mg | CHEWABLE_TABLET | ORAL | Status: DC | PRN

## 2014-12-29 MED ORDER — OXYCODONE-ACETAMINOPHEN 5-325 MG PO TABS
1.0000 | ORAL_TABLET | ORAL | Status: DC | PRN
  Administered 2014-12-31: 1 via ORAL
  Filled 2014-12-29: qty 1

## 2014-12-29 MED ORDER — ACETAMINOPHEN 325 MG PO TABS
650.0000 mg | ORAL_TABLET | ORAL | Status: DC | PRN
  Administered 2014-12-30 – 2014-12-31 (×2): 650 mg via ORAL
  Filled 2014-12-29 (×2): qty 2

## 2014-12-29 MED ORDER — DIPHENHYDRAMINE HCL 50 MG/ML IJ SOLN
12.5000 mg | INTRAMUSCULAR | Status: DC | PRN
  Administered 2014-12-29: 12.5 mg via INTRAVENOUS
  Filled 2014-12-29: qty 1

## 2014-12-29 MED ORDER — METHYLERGONOVINE MALEATE 0.2 MG/ML IJ SOLN: 0.2000 mg | INTRAMUSCULAR | Status: DC | PRN

## 2014-12-29 MED ORDER — TETANUS-DIPHTH-ACELL PERTUSSIS 5-2.5-18.5 LF-MCG/0.5 IM SUSP
0.5000 mL | Freq: Once | INTRAMUSCULAR | Status: AC
  Administered 2014-12-30: 0.5 mL via INTRAMUSCULAR
  Filled 2014-12-29: qty 0.5

## 2014-12-29 MED ORDER — SIMETHICONE 80 MG PO CHEW
80.0000 mg | CHEWABLE_TABLET | Freq: Three times a day (TID) | ORAL | Status: DC
  Administered 2014-12-29 – 2014-12-31 (×5): 80 mg via ORAL
  Filled 2014-12-29 (×5): qty 1

## 2014-12-29 MED ORDER — ONDANSETRON HCL 4 MG/2ML IJ SOLN
INTRAMUSCULAR | Status: AC
  Filled 2014-12-29: qty 2

## 2014-12-29 MED ORDER — KETOROLAC TROMETHAMINE 30 MG/ML IJ SOLN
INTRAMUSCULAR | Status: AC
  Filled 2014-12-29: qty 1

## 2014-12-29 MED ORDER — LACTATED RINGERS IV SOLN
INTRAVENOUS | Status: DC
Start: 2014-12-29 — End: 2014-12-31
  Administered 2014-12-29: 20:00:00 via INTRAVENOUS

## 2014-12-29 MED ORDER — SENNOSIDES-DOCUSATE SODIUM 8.6-50 MG PO TABS
2.0000 | ORAL_TABLET | ORAL | Status: DC
  Administered 2014-12-29 – 2014-12-30 (×2): 2 via ORAL
  Filled 2014-12-29 (×2): qty 2

## 2014-12-29 MED ORDER — ONDANSETRON HCL 4 MG/2ML IJ SOLN: 4.0000 mg | Freq: Once | INTRAMUSCULAR | Status: DC | PRN

## 2014-12-29 MED ORDER — MEPERIDINE HCL 25 MG/ML IJ SOLN
6.2500 mg | INTRAMUSCULAR | Status: DC | PRN
Start: 2014-12-29 — End: 2014-12-29

## 2014-12-29 MED ORDER — MENTHOL 3 MG MT LOZG: 1.0000 | LOZENGE | OROMUCOSAL | Status: DC | PRN

## 2014-12-29 MED ORDER — BUPIVACAINE LIPOSOME 1.3 % IJ SUSP
20.0000 mL | Freq: Once | INTRAMUSCULAR | Status: AC
  Administered 2014-12-29: 20 mL
  Filled 2014-12-29: qty 20

## 2014-12-29 MED ORDER — 0.9 % SODIUM CHLORIDE (POUR BTL) OPTIME
TOPICAL | Status: DC | PRN
  Administered 2014-12-29: 1000 mL

## 2014-12-29 MED ORDER — LEVOTHYROXINE SODIUM 25 MCG PO TABS
25.0000 ug | ORAL_TABLET | Freq: Every day | ORAL | Status: DC
  Administered 2014-12-30 – 2014-12-31 (×2): 25 ug via ORAL
  Filled 2014-12-29 (×2): qty 1

## 2014-12-29 MED ORDER — DIPHENHYDRAMINE HCL 25 MG PO CAPS: 25.0000 mg | ORAL_CAPSULE | Freq: Four times a day (QID) | ORAL | Status: DC | PRN

## 2014-12-29 MED ORDER — CEFAZOLIN SODIUM-DEXTROSE 2-3 GM-% IV SOLR
2.0000 g | INTRAVENOUS | Status: AC
  Administered 2014-12-29: 2 g via INTRAVENOUS

## 2014-12-29 MED ORDER — SODIUM CHLORIDE 0.9 % IJ SOLN
INTRAMUSCULAR | Status: AC
  Filled 2014-12-29: qty 50

## 2014-12-29 MED ORDER — KETOROLAC TROMETHAMINE 30 MG/ML IJ SOLN: 30.0000 mg | Freq: Four times a day (QID) | INTRAMUSCULAR | Status: DC | PRN

## 2014-12-29 MED ORDER — NALOXONE HCL 1 MG/ML IJ SOLN
1.0000 ug/kg/h | INTRAVENOUS | Status: DC | PRN
  Filled 2014-12-29: qty 2

## 2014-12-29 MED ORDER — CEFAZOLIN SODIUM-DEXTROSE 2-3 GM-% IV SOLR
INTRAVENOUS | Status: AC
  Filled 2014-12-29: qty 50

## 2014-12-29 MED ORDER — DIBUCAINE 1 % RE OINT: 1.0000 "application " | TOPICAL_OINTMENT | RECTAL | Status: DC | PRN

## 2014-12-29 MED ORDER — BUPIVACAINE IN DEXTROSE 0.75-8.25 % IT SOLN
INTRATHECAL | Status: DC | PRN
  Administered 2014-12-29: 1.8 mL via INTRATHECAL

## 2014-12-29 MED ORDER — MORPHINE SULFATE (PF) 0.5 MG/ML IJ SOLN
INTRAMUSCULAR | Status: DC | PRN
  Administered 2014-12-29: .2 mg via EPIDURAL

## 2014-12-29 MED ORDER — ONDANSETRON HCL 4 MG/2ML IJ SOLN: 4.0000 mg | Freq: Three times a day (TID) | INTRAMUSCULAR | Status: DC | PRN

## 2014-12-29 MED ORDER — METHYLERGONOVINE MALEATE 0.2 MG PO TABS: 0.2000 mg | ORAL_TABLET | ORAL | Status: DC | PRN

## 2014-12-29 MED ORDER — ZOLPIDEM TARTRATE 5 MG PO TABS: 5.0000 mg | ORAL_TABLET | Freq: Every evening | ORAL | Status: DC | PRN

## 2014-12-29 MED ORDER — PHENYLEPHRINE 8 MG IN D5W 100 ML (0.08MG/ML) PREMIX OPTIME
INJECTION | INTRAVENOUS | Status: AC
  Filled 2014-12-29: qty 100

## 2014-12-29 MED ORDER — BUPIVACAINE HCL (PF) 0.25 % IJ SOLN
INTRAMUSCULAR | Status: AC
  Filled 2014-12-29: qty 30

## 2014-12-29 MED ORDER — SCOPOLAMINE 1 MG/3DAYS TD PT72
MEDICATED_PATCH | TRANSDERMAL | Status: AC
  Administered 2014-12-29: 1.5 mg via TRANSDERMAL
  Filled 2014-12-29: qty 1

## 2014-12-29 MED ORDER — DEXAMETHASONE SODIUM PHOSPHATE 4 MG/ML IJ SOLN
INTRAMUSCULAR | Status: DC | PRN
  Administered 2014-12-29: 4 mg via INTRAVENOUS

## 2014-12-29 MED ORDER — NALBUPHINE HCL 10 MG/ML IJ SOLN
5.0000 mg | Freq: Once | INTRAMUSCULAR | Status: AC
  Administered 2014-12-29: 5 mg via SUBCUTANEOUS

## 2014-12-29 MED ORDER — KETOROLAC TROMETHAMINE 30 MG/ML IJ SOLN
30.0000 mg | Freq: Four times a day (QID) | INTRAMUSCULAR | Status: DC | PRN
  Administered 2014-12-29: 30 mg via INTRAMUSCULAR

## 2014-12-29 MED ORDER — OXYCODONE-ACETAMINOPHEN 5-325 MG PO TABS: 2.0000 | ORAL_TABLET | ORAL | Status: DC | PRN

## 2014-12-29 MED ORDER — SODIUM CHLORIDE 0.9 % IJ SOLN: 3.0000 mL | INTRAMUSCULAR | Status: DC | PRN

## 2014-12-29 SURGICAL SUPPLY — 37 items
ADH SKN CLS APL DERMABOND .7 (GAUZE/BANDAGES/DRESSINGS) ×1
CLAMP CORD UMBIL (MISCELLANEOUS) IMPLANT
CLOTH BEACON ORANGE TIMEOUT ST (SAFETY) ×3 IMPLANT
CONTAINER PREFILL 10% NBF 15ML (MISCELLANEOUS) IMPLANT
DERMABOND ADVANCED (GAUZE/BANDAGES/DRESSINGS) ×2
DERMABOND ADVANCED .7 DNX12 (GAUZE/BANDAGES/DRESSINGS) IMPLANT
DRAPE SHEET LG 3/4 BI-LAMINATE (DRAPES) IMPLANT
DRSG OPSITE POSTOP 4X10 (GAUZE/BANDAGES/DRESSINGS) ×3 IMPLANT
DURAPREP 26ML APPLICATOR (WOUND CARE) ×3 IMPLANT
ELECT REM PT RETURN 9FT ADLT (ELECTROSURGICAL) ×3
ELECTRODE REM PT RTRN 9FT ADLT (ELECTROSURGICAL) ×1 IMPLANT
EXTRACTOR VACUUM M CUP 4 TUBE (SUCTIONS) IMPLANT
EXTRACTOR VACUUM M CUP 4' TUBE (SUCTIONS)
GLOVE BIO SURGEON STRL SZ7.5 (GLOVE) ×3 IMPLANT
GOWN STRL REUS W/TWL LRG LVL3 (GOWN DISPOSABLE) ×6 IMPLANT
KIT ABG SYR 3ML LUER SLIP (SYRINGE) IMPLANT
NDL HYPO 25X5/8 SAFETYGLIDE (NEEDLE) IMPLANT
NDL SPNL 20GX3.5 QUINCKE YW (NEEDLE) IMPLANT
NEEDLE HYPO 22GX1.5 SAFETY (NEEDLE) ×3 IMPLANT
NEEDLE HYPO 25X5/8 SAFETYGLIDE (NEEDLE) IMPLANT
NEEDLE SPNL 20GX3.5 QUINCKE YW (NEEDLE) IMPLANT
NS IRRIG 1000ML POUR BTL (IV SOLUTION) ×3 IMPLANT
PACK C SECTION WH (CUSTOM PROCEDURE TRAY) ×3 IMPLANT
PAD OB MATERNITY 4.3X12.25 (PERSONAL CARE ITEMS) ×3 IMPLANT
SUT MNCRL 0 VIOLET CTX 36 (SUTURE) ×2 IMPLANT
SUT MNCRL AB 3-0 PS2 27 (SUTURE) IMPLANT
SUT MON AB 2-0 CT1 27 (SUTURE) ×3 IMPLANT
SUT MON AB-0 CT1 36 (SUTURE) ×6 IMPLANT
SUT MONOCRYL 0 CTX 36 (SUTURE) ×4
SUT PLAIN 0 NONE (SUTURE) IMPLANT
SUT PLAIN 2 0 (SUTURE)
SUT PLAIN 2 0 XLH (SUTURE) IMPLANT
SUT PLAIN ABS 2-0 CT1 27XMFL (SUTURE) IMPLANT
SYR 20CC LL (SYRINGE) IMPLANT
SYR CONTROL 10ML LL (SYRINGE) ×3 IMPLANT
TOWEL OR 17X24 6PK STRL BLUE (TOWEL DISPOSABLE) ×3 IMPLANT
TRAY FOLEY CATH SILVER 14FR (SET/KITS/TRAYS/PACK) ×3 IMPLANT

## 2014-12-29 NOTE — Anesthesia Postprocedure Evaluation (Signed)
  Anesthesia Post-op Note  Patient: Bonnie Fisher  Procedure(s) Performed: Procedure(s) with comments: REPEAT CESAREAN SECTION WITH BILATERAL TUBAL LIGATION (Bilateral) - EDD: 01/04/15 Allergy: Vicodin  Patient Location: PACU and Mother/Baby  Anesthesia Type:Spinal  Level of Consciousness: awake, alert  and oriented  Airway and Oxygen Therapy: Patient Spontanous Breathing  Post-op Pain: none  Post-op Assessment: Post-op Vital signs reviewed and Patient's Cardiovascular Status Stable LLE Motor Response: Purposeful movement LLE Sensation: Decreased, Tingling RLE Motor Response: Purposeful movement RLE Sensation: Decreased, Tingling      Post-op Vital Signs: Reviewed and stable  Last Vitals:  Filed Vitals:   12/29/14 1558  BP: 105/60  Pulse: 66  Temp: 36.5 C  Resp: 18    Complications: No apparent anesthesia complications

## 2014-12-29 NOTE — Progress Notes (Signed)
Patient ID: Bonnie Fisher, female   DOB: 13-Sep-1980, 34 y.o.   MRN: [id number] Patient seen and examined. Consent witnessed and signed. No changes noted. Update completed. CBC    Component Value Date/Time   WBC 9.8 12/26/2014 0845   RBC 4.04 12/26/2014 0845   HGB 9.6* 12/26/2014 0845   HCT 30.6* 12/26/2014 0845   PLT 241 12/26/2014 0845   MCV 75.7* 12/26/2014 0845   MCH 23.8* 12/26/2014 0845   MCHC 31.4 12/26/2014 0845   RDW 14.7 12/26/2014 0845

## 2014-12-29 NOTE — Anesthesia Procedure Notes (Signed)
Spinal Patient location during procedure: OR Staffing Anesthesiologist: Yechiel Erny Performed by: anesthesiologist  Preanesthetic Checklist Completed: patient identified, site marked, surgical consent, pre-op evaluation, timeout performed, IV checked, risks and benefits discussed and monitors and equipment checked Spinal Block Patient position: sitting Prep: ChloraPrep Patient monitoring: continuous pulse ox, blood pressure and heart rate Approach: midline Location: L3-4 Injection technique: single-shot Needle Needle type: Sprotte  Needle gauge: 24 G Needle length: 9 cm Additional Notes Functioning IV was confirmed and monitors were applied. Sterile prep and drape, including hand hygiene, mask and sterile gloves were used. The patient was positioned and the spine was prepped. The skin was anesthetized with lidocaine.  Free flow of clear CSF was obtained prior to injecting local anesthetic into the CSF.  The spinal needle aspirated freely following injection.  The needle was carefully withdrawn.  The patient tolerated the procedure well. Consent was obtained prior to procedure with all questions answered and concerns addressed. Risks including but not limited to bleeding, infection, nerve damage, paralysis, failed block, inadequate analgesia, allergic reaction, high spinal, itching and headache were discussed and the patient wished to proceed.   Lavanya Roa, MD     

## 2014-12-29 NOTE — Op Note (Signed)
Cesarean Section Procedure Note  Indications: previous uterine incision kerr x3 or greater  Pre-operative Diagnosis: 39 week 1 day pregnancy.  Post-operative Diagnosis: same  Surgeon: Lenoard AdenAAVON,Elior Robinette J   Assistants: Denyse AmassBhambri, CNM  Anesthesia: Local anesthesia 0.25.% bupivacaine and Spinal anesthesia  ASA Class: 2  Procedure Details  The patient was seen in the Holding Room. The risks, benefits, complications, treatment options, and expected outcomes were discussed with the patient.  The patient concurred with the proposed plan, giving informed consent. The risks of anesthesia, infection, bleeding and possible injury to other organs discussed. Injury to bowel, bladder, or ureter with possible need for repair discussed. Possible need for transfusion with secondary risks of hepatitis or HIV acquisition discussed. Post operative complications to include but not limited to DVT, PE and Pneumonia noted. The site of surgery properly noted/marked. The patient was taken to Operating Room # 9, identified as Bonnie Fisher and the procedure verified as C-Section Delivery. A Time Out was held and the above information confirmed.  After induction of anesthesia, the patient was draped and prepped in the usual sterile manner. A Pfannenstiel incision was made and carried down through the subcutaneous tissue to the fascia. Fascial incision was made and extended transversely using Mayo scissors. The fascia was separated from the underlying rectus tissue superiorly and inferiorly. The peritoneum was identified and entered. Peritoneal incision was extended longitudinally. The utero-vesical peritoneal reflection was incised transversely and the bladder flap was bluntly freed from the lower uterine segment. A low transverse uterine incision(Kerr hysterotomy) was made through LUS window. Delivered from OA vertex presentation was a  female with Apgar scores of 9 at one minute and 9 at five minutes. Bulb suctioning gently  performed. Neonatal team in attendance.After the umbilical cord was clamped and cut cord blood was obtained for evaluation. The placenta was removed intact and appeared normal. The uterus was curetted with a dry lap pack. Good hemostasis was noted.The uterine outline, tubes and ovaries appeared normal. The uterine incision was closed with running locked sutures of 0 Monocryl x 2 layers. Hemostasis was observed. Modified Pomeroy tubal ligation performed without complications.The parietal peritoneum was closed with a running 2-0 Monocryl suture. The fascia was then reapproximated with running sutures of 0 Monocryl. The skin was reapproximated with 3-0 monocryl after Ramsey closure with 2-0 plain.  Instrument, sponge, and needle counts were correct prior the abdominal closure and at the conclusion of the case.   Findings: FTLF,  LUS window, two layer closure  Estimated Blood Loss:  300 mL         Drains: foley                 Specimens: placenta and tubal segments                 Complications:  None; patient tolerated the procedure well.         Disposition: PACU - hemodynamically stable.         Condition: stable  Attending Attestation: I performed the procedure.

## 2014-12-29 NOTE — Addendum Note (Signed)
Addendum  created 12/29/14 1726 by Elgie CongoNataliya H Donnette Macmullen, CRNA   Modules edited: Notes Section   Notes Section:  File: [id number]

## 2014-12-29 NOTE — Transfer of Care (Signed)
Immediate Anesthesia Transfer of Care Note  Patient: Bonnie Fisher  Procedure(s) Performed: Procedure(s) with comments: REPEAT CESAREAN SECTION WITH BILATERAL TUBAL LIGATION (Bilateral) - EDD: 01/04/15 Allergy: Vicodin  Patient Location: PACU  Anesthesia Type:Spinal  Level of Consciousness: awake, alert  and oriented  Airway & Oxygen Therapy: Patient Spontanous Breathing  Post-op Assessment: Report given to RN and Post -op Vital signs reviewed and stable  Post vital signs: Reviewed and stable  Last Vitals:  Filed Vitals:   12/29/14 0837  BP: 123/71  Pulse: 69  Temp:   Resp: 20    Complications: No apparent anesthesia complications

## 2014-12-29 NOTE — Lactation Note (Signed)
This note was copied from the chart of Bonnie Fisher. Lactation Consultation Note  Patient Name: Bonnie Samuella CotaHeather Strassner OZHYQ'MToday's Date: 12/29/2014 Reason for consult: Initial assessment  Initial consult at 5413 hours old; Mom is a P1.  Hx of hypothyroidism on synthroid, AMA. Mom, dad, and baby were resting in [facility]  Mom looked up and saw LC.   LC quietly asked mom how breastfeeding is going and mom stated it was going well. Infant has breastfed x5 (15-45 min); voids-1; stools-0 since birth 13 hours ago. Lactation brochure given and informed of hospital support group and outpatient services. Encouraged mom to call for assistance during the night as needed.    Consult Status Consult Status: Follow-up Date: 12/30/14 Follow-up type: In-patient    Lendon KaVann, Tyhesha Dutson Walker 12/29/2014, 11:31 PM

## 2014-12-29 NOTE — Anesthesia Postprocedure Evaluation (Signed)
  Anesthesia Post-op Note  Patient: Bonnie Fisher  Procedure(s) Performed: Procedure(s) (LRB): REPEAT CESAREAN SECTION WITH BILATERAL TUBAL LIGATION (Bilateral)  Patient Location: PACU  Anesthesia Type: Spinal  Level of Consciousness: awake and alert   Airway and Oxygen Therapy: Patient Spontanous Breathing  Post-op Pain: mild  Post-op Assessment: Post-op Vital signs reviewed, Patient's Cardiovascular Status Stable, Respiratory Function Stable, Patent Airway and No signs of Nausea or vomiting  Last Vitals:  Filed Vitals:   12/29/14 1045  BP: 100/73  Pulse: 73  Temp:   Resp: 17    Post-op Vital Signs: stable   Complications: No apparent anesthesia complications

## 2014-12-30 ENCOUNTER — Encounter (HOSPITAL_COMMUNITY): Payer: Self-pay | Admitting: *Deleted

## 2014-12-30 LAB — CBC
HEMATOCRIT: 27.3 % — AB (ref 36.0–46.0)
HEMOGLOBIN: 8.6 g/dL — AB (ref 12.0–15.0)
MCH: 23.8 pg — AB (ref 26.0–34.0)
MCHC: 31.5 g/dL (ref 30.0–36.0)
MCV: 75.4 fL — ABNORMAL LOW (ref 78.0–100.0)
PLATELETS: 205 10*3/uL (ref 150–400)
RBC: 3.62 MIL/uL — ABNORMAL LOW (ref 3.87–5.11)
RDW: 14.8 % (ref 11.5–15.5)
WBC: 11.4 10*3/uL — ABNORMAL HIGH (ref 4.0–10.5)

## 2014-12-30 LAB — TYPE AND SCREEN
ABO/RH(D): O POS
ANTIBODY SCREEN: NEGATIVE
UNIT DIVISION: 0
UNIT DIVISION: 0

## 2014-12-30 LAB — BIRTH TISSUE RECOVERY COLLECTION (PLACENTA DONATION)

## 2014-12-30 MED ORDER — POLYSACCHARIDE IRON COMPLEX 150 MG PO CAPS
150.0000 mg | ORAL_CAPSULE | Freq: Two times a day (BID) | ORAL | Status: DC
  Administered 2014-12-30 – 2014-12-31 (×3): 150 mg via ORAL
  Filled 2014-12-30 (×3): qty 1

## 2014-12-30 MED ORDER — MAGNESIUM OXIDE 400 (241.3 MG) MG PO TABS
400.0000 mg | ORAL_TABLET | Freq: Every day | ORAL | Status: DC
  Administered 2014-12-30 – 2014-12-31 (×2): 400 mg via ORAL
  Filled 2014-12-30 (×2): qty 1

## 2014-12-30 NOTE — Progress Notes (Addendum)
Patient ID: Bonnie Fisher, female   DOB: 10/04/1980, 34 y.o.   MRN: [id number] Subjective: S/P repeat Cesarean Delivery POD# 1 Information for the patient's newborn:  Dashay, Giesler [[id number]]  female  Reports feeling well Feeding: breast Patient reports tolerating PO.  Breast symptoms: none Pain controlled with ibuprofen (OTC) and narcotic analgesics including Percocet Denies HA/SOB/C/P/N/V/dizziness. Flatus absent. No BM. She reports vaginal bleeding as normal, without clots.  She is ambulating, urinating without difficult.     Objective:   VS:  Filed Vitals:   12/29/14 1945 12/29/14 2325 12/30/14 0345 12/30/14 0750  BP: 113/62 118/63 114/73 110/71  Pulse: 61 67 53 71  Temp: 97.7 F (36.5 C) 98.3 F (36.8 C) 97.8 F (36.6 C) 97.6 F (36.4 C)  TempSrc: Oral Oral Oral Oral  Resp: $Remo'18 18 18 16  'jboKA$ Height:      Weight:      SpO2: 97% 97% 100% 100%     Intake/Output Summary (Last 24 hours) at 12/30/14 8315 Last data filed at 12/30/14 0400  Gross per 24 hour  Intake    400 ml  Output   3325 ml  Net  -2925 ml        Recent Labs  12/30/14 0605  WBC 11.4*  HGB 8.6*  HCT 27.3*  PLT 205     Blood type: O POS (07/08 0845)  Rubella: Non-Immune    Physical Exam:  General: alert, cooperative and no distress CV: Regular rate and rhythm, S1S2 present or without murmur or extra heart sounds Resp: clear Abdomen: soft, nontender, normal bowel sounds Incision: Tegaderm and Honeycomb - C/D/I / skin well-approximated with sutures Uterine Fundus: firm, 2 FB below umbilicus, nontender Lochia: minimal Ext: extremities normal, atraumatic, no cyanosis or edema, Homans sign is negative, no sign of DVT and no edema, redness or tenderness in the calves or thighs   Assessment/Plan: 34 y.o.   POD# 1.  s/p Cesarean Delivery.  Indications: Repeat                Principal Problem: Postpartum care following cesarean delivery (7/11) Active Problems: Hypothyroidism Cesarean  delivery delivered IDA with compounding ABL Anemia  Doing well, stable.               Regular diet as tolerated Start Niferex 150 mg BID Start Magnesium Oxide 400 mg daily Increase hydration Ambulate Routine post-op care Rubella Non-Immune - MMR prior to discharge Desires early discharge home tomorrow  Laury Deep, M, MSN, CNM 12/30/2014, 9:21 AM

## 2014-12-31 DIAGNOSIS — O99892 Other specified diseases and conditions complicating childbirth: Secondary | ICD-10-CM

## 2014-12-31 DIAGNOSIS — E039 Hypothyroidism, unspecified: Secondary | ICD-10-CM | POA: Diagnosis present

## 2014-12-31 DIAGNOSIS — D509 Iron deficiency anemia, unspecified: Secondary | ICD-10-CM | POA: Diagnosis present

## 2014-12-31 DIAGNOSIS — O9989 Other specified diseases and conditions complicating pregnancy, childbirth and the puerperium: Secondary | ICD-10-CM

## 2014-12-31 DIAGNOSIS — Z283 Underimmunization status: Secondary | ICD-10-CM

## 2014-12-31 DIAGNOSIS — D62 Acute posthemorrhagic anemia: Secondary | ICD-10-CM | POA: Diagnosis not present

## 2014-12-31 DIAGNOSIS — O99019 Anemia complicating pregnancy, unspecified trimester: Secondary | ICD-10-CM

## 2014-12-31 MED ORDER — OXYCODONE-ACETAMINOPHEN 5-325 MG PO TABS: 1.0000 | ORAL_TABLET | ORAL | Status: AC | PRN

## 2014-12-31 MED ORDER — IBUPROFEN 600 MG PO TABS: 600.0000 mg | ORAL_TABLET | Freq: Four times a day (QID) | ORAL | Status: AC

## 2014-12-31 MED ORDER — MEASLES, MUMPS & RUBELLA VAC ~~LOC~~ INJ
0.5000 mL | INJECTION | Freq: Once | SUBCUTANEOUS | Status: AC
  Administered 2014-12-31: 0.5 mL via SUBCUTANEOUS
  Filled 2014-12-31: qty 0.5

## 2014-12-31 MED ORDER — POLYSACCHARIDE IRON COMPLEX 150 MG PO CAPS: 150.0000 mg | ORAL_CAPSULE | Freq: Two times a day (BID) | ORAL | Status: AC

## 2014-12-31 MED ORDER — MAGNESIUM OXIDE 400 (241.3 MG) MG PO TABS: 400.0000 mg | ORAL_TABLET | Freq: Every day | ORAL | Status: AC

## 2014-12-31 NOTE — Progress Notes (Signed)
POD # 2  Subjective: Pt reports feeling well, desires early discharge/ Pain controlled with Motrin and Percocet Tolerating po/Voiding without problems/ No n/v/ Flatus present, +BM Activity: ad lib Bleeding is light Newborn info:  Information for the patient's newborn:  Bonnie, Fisher [[id number]]  female Feeding: breast   Objective: VS: VS:  Filed Vitals:   12/30/14 0345 12/30/14 0750 12/30/14 1833 12/31/14 0520  BP: 114/73 110/71 110/71 114/71  Pulse: 53 71 63 73  Temp: 97.8 F (36.6 C) 97.6 F (36.4 C) 98 F (36.7 C) 98.3 F (36.8 C)  TempSrc: Oral Oral Oral Oral  Resp: $Remo'18 16 18 18  'Apufm$ Height:      Weight:      SpO2: 100% 100%      I&O: Intake/Output      07/12 0701 - 07/13 0700 07/13 0701 - 07/14 0700   I.V. (mL/kg)     Total Intake(mL/kg)     Urine (mL/kg/hr)     Blood     Total Output       Net              LABS:  Recent Labs  12/30/14 0605  WBC 11.4*  HGB 8.6*  PLT 205   Blood type: --/--/O POS (07/08 0845) Rubella:             Physical Exam:  General: alert, cooperative and no distress CV: Regular rate and rhythm Resp: CTA bilaterally Abdomen: soft, nontender, normal bowel sounds Incision: Covered with Tegaderm and honeycomb dressing; no significant drainage, edema, bruising, or erythema; well approximated with suture Uterine Fundus: firm, below umbilicus, nontender Lochia: minimal Ext: extremities normal, atraumatic, no cyanosis or edema and Homans sign is negative, no sign of DVT    Assessment: POD # 2/ [id number]/ S/P C/Section & BTL d/t repeat  IDA with compounding ABL anemia Rubella non-immune Hypothyroidism-stable Doing well and stable for discharge home  Plan: MMR prior to d/c Discharge home RX's: Ibuprofen $RemoveBeforeD'600mg'[id number]$  po Q 6 hrs prn pain #30 Refill x 1 Percocet 5/325 1 - 2 tabs po every 4 hrs prn pain #30 Refill x 0 Niferex $RemoveB'150mg'qklpKWJm$  po BID #60 Refill x 1 Continue Synthroid 25 mcg po daily Follow up in 6 wks for postpartum check at  [facility] booklet given    Signed: Julianne Handler, Delane Ginger, MSN, CNM 12/31/2014, 9:17 AM

## 2014-12-31 NOTE — Discharge Summary (Signed)
DISCHARGE SUMMARY:  Patient ID: FAIGY STRETCH MRN: [id number] DOB/AGE: March 09, 1981 34 y.o.  Admit date: 12/29/2014 Admission Diagnoses: 39.[redacted] weeks gestation, previous CS, undesired fertility   Discharge date: 12/31/2014 Discharge Diagnoses: S/P C/S and BTL on 12/29/14, IDA with compounding ABL anemia, Hypothyroidism, Rubella non-immune        Prenatal history: [id number]   EDC: 01/04/2015, by Other Basis  Prenatal care at [facility] since [redacted] wks gestation. Primary provider: Dr. Billy Coast Prenatal course complicated by previous CS x3, Hypothyroidism, IDA, +GBS, AMA  Prenatal labs: ABO, Rh: --/--/O POS (07/08 0845)  Antibody: NEG (07/08 0845) Rubella:  Non-immune RPR: Non Reactive (07/08 0845)  HBsAg: Negative (01/12 0000)  HIV: Non-reactive (01/12 0000)  GBS:   POS GTT: 109  Medical / Surgical History :  Past medical history:  Past Medical History  Diagnosis Date  . Hashimoto's disease     thyroid -autoimmune disease  . SAB (spontaneous abortion)     x 1 - no surgery required  . Hypothyroidism   . Anemia     Past surgical history:  Past Surgical History  Procedure Laterality Date  . Cesarean section      x 3  . Ankle surgery      left  . Tonsillectomy    . Wisdom tooth extraction    . Eye surgery      bilateral - lasik  . Cesarean section with bilateral tubal ligation Bilateral 12/29/2014    Procedure: REPEAT CESAREAN SECTION WITH BILATERAL TUBAL LIGATION;  Surgeon: Olivia Mackie, MD;  Location: [facility];  Service: Obstetrics;  Laterality: Bilateral;  EDD: 01/04/15 Allergy: Vicodin     Medications on Admission: Prescriptions prior to admission  Medication Sig Dispense Refill Last Dose  . IRON PO Take 1 tablet by mouth at bedtime.   12/28/2014 at Unknown time  . levothyroxine (SYNTHROID, LEVOTHROID) 25 MCG tablet Take 25 mcg by mouth daily before breakfast.   12/29/2014 at 0530  . Prenatal Vit-Fe Fumarate-FA (PRENATAL MULTIVITAMIN) TABS Take 1  tablet by mouth every morning.   12/28/2014 at Unknown time    Allergies: Vicodin   Intrapartum Course:  Admitted for scheduled rpt CS and BTL under regional anesthesia   Postpartum Course: Complicated by ABL anemia  Physical Exam:   VSS: Blood pressure 114/71, pulse 73, temperature 98.3 F (36.8 C), temperature source Oral, resp. rate 18, height 6' (1.829 m), weight 117.482 kg (259 lb), SpO2 100 %, unknown if currently breastfeeding.  LABS:  Recent Labs  12/30/14 0605  WBC 11.4*  HGB 8.6*  PLT 205    General: alert and oriented x3 Heart: RRR Lungs: CTA bilaterally GI: soft, non-tender, non-distended, BS x4 Lochia: small Uterus: firm below umbilicus Incision: well approximated with suture; honeycomb dressing-no significant erythema, drainage, or edema Extremities: no edema, Homans neg   Newborn Data Live born female  Birth Weight: 8 lb 2.2 oz (3691 g) APGAR: 8, 9  See operative report for further details  Home with mother.  Discharge Instructions:  Wound Care: keep clean and dry / remove honeycomb POD 5 Postpartum Instructions: [facility] discharge booklet - instructions reviewed Medications:    Medication List    STOP taking these medications        IRON PO      TAKE these medications        ibuprofen 600 MG tablet  Commonly known as:  ADVIL,MOTRIN  Take 1 tablet (600 mg total) by mouth every 6 (six) hours.  iron polysaccharides 150 MG capsule  Commonly known as:  NIFEREX  Take 1 capsule (150 mg total) by mouth 2 (two) times daily.     levothyroxine 25 MCG tablet  Commonly known as:  SYNTHROID, LEVOTHROID  Take 25 mcg by mouth daily before breakfast.     magnesium oxide 400 (241.3 MG) MG tablet  Commonly known as:  MAG-OX  Take 1 tablet (400 mg total) by mouth daily.     oxyCODONE-acetaminophen 5-325 MG per tablet  Commonly known as:  PERCOCET/ROXICET  Take 1-2 tablets by mouth every 4 (four) hours as needed (for pain scale greater than  7).     prenatal multivitamin Tabs tablet  Take 1 tablet by mouth every morning.            Follow-up Information    Follow up with Lenoard AdenAAVON,RICHARD J, MD. Schedule an appointment as soon as possible for a visit in 6 weeks.   Specialty:  Obstetrics and Gynecology   Contact information:   [address] [place] [address] [phone]         Signed: Donette LarryBHAMBRI, Aleyna Cueva, Dorris CarnesN MSN, CNM 12/31/2014, 9:31 AM

## 2021-11-27 ENCOUNTER — Other Ambulatory Visit: Payer: Self-pay

## 2021-11-27 ENCOUNTER — Encounter (HOSPITAL_BASED_OUTPATIENT_CLINIC_OR_DEPARTMENT_OTHER): Payer: Self-pay | Admitting: Emergency Medicine

## 2021-11-27 ENCOUNTER — Emergency Department (HOSPITAL_BASED_OUTPATIENT_CLINIC_OR_DEPARTMENT_OTHER): Payer: Managed Care, Other (non HMO)

## 2021-11-27 ENCOUNTER — Emergency Department (HOSPITAL_BASED_OUTPATIENT_CLINIC_OR_DEPARTMENT_OTHER)
Admission: EM | Admit: 2021-11-27 | Discharge: 2021-11-27 | Disposition: A | Discharge status: Home / Self Care | Payer: Managed Care, Other (non HMO) | Attending: Emergency Medicine | Admitting: Emergency Medicine

## 2021-11-27 DIAGNOSIS — W458XXA Other foreign body or object entering through skin, initial encounter: Secondary | ICD-10-CM | POA: Insufficient documentation

## 2021-11-27 DIAGNOSIS — S90852A Superficial foreign body, left foot, initial encounter: Secondary | ICD-10-CM | POA: Diagnosis present

## 2021-11-27 NOTE — ED Triage Notes (Signed)
Pt thinks she might have some glass in LT foot since Mon

## 2021-11-27 NOTE — ED Provider Notes (Signed)
[facility] EMERGENCY DEPARTMENT Provider Note   CSN: [id number] Arrival date & time: 11/27/21  1118     History  Chief Complaint  Patient presents with   Foreign Body in Skin    Bonnie Fisher is a 41 y.o. female who presents emergency department for evaluation of a suspected foreign body in the bottom of her left foot.  Patient states that she stepped on something about 5 days ago.  She continues to have discomfort that is worse when walking.  She does have relief when wearing cushioned tennis shoes.  She has been doing soaks and her husband attempted to squeeze the object out last night.  She denies numbness, tingling, fevers.  She went to urgent care prior to coming here but their x-ray machine was not working and they referred her to the emergency department.  HPI     Home Medications Prior to Admission medications   Medication Sig Start Date End Date Taking? Authorizing Provider  ibuprofen (ADVIL,MOTRIN) 600 MG tablet Take 1 tablet (600 mg total) by mouth every 6 (six) hours. 12/31/14   Donette Larry, CNM  iron polysaccharides (NIFEREX) 150 MG capsule Take 1 capsule (150 mg total) by mouth 2 (two) times daily. 12/31/14   Donette Larry, CNM  levothyroxine (SYNTHROID, LEVOTHROID) 25 MCG tablet Take 25 mcg by mouth daily before breakfast.    [provider]  magnesium oxide (MAG-OX) 400 (241.3 MG) MG tablet Take 1 tablet (400 mg total) by mouth daily. 12/31/14   Donette Larry, CNM  oxyCODONE-acetaminophen (PERCOCET/ROXICET) 5-325 MG per tablet Take 1-2 tablets by mouth every 4 (four) hours as needed (for pain scale greater than 7). 12/31/14   Donette Larry, CNM  Prenatal Vit-Fe Fumarate-FA (PRENATAL MULTIVITAMIN) TABS Take 1 tablet by mouth every morning.    [provider]      Allergies    Vicodin [hydrocodone-acetaminophen]    Review of Systems   Review of Systems  Physical Exam Updated Vital Signs BP (!) 123/92 (BP Location: Right  Arm)   Pulse 72   Temp 97.9 F (36.6 C) (Oral)   Resp 18   Ht 6' (1.829 m)   Wt 127 kg   SpO2 100%   BMI 37.97 kg/m  Physical Exam Vitals and nursing note reviewed.  Constitutional:      General: She is not in acute distress.    Appearance: She is not ill-appearing.  HENT:     Head: Atraumatic.  Eyes:     Conjunctiva/sclera: Conjunctivae normal.  Cardiovascular:     Rate and Rhythm: Normal rate and regular rhythm.     Pulses: Normal pulses.     Heart sounds: No murmur heard. Pulmonary:     Effort: Pulmonary effort is normal. No respiratory distress.     Breath sounds: Normal breath sounds.  Abdominal:     General: Abdomen is flat. There is no distension.     Palpations: Abdomen is soft.     Tenderness: There is no abdominal tenderness.  Musculoskeletal:        General: Normal range of motion.     Cervical back: Normal range of motion.       Feet:  Feet:     Comments: Small area of nonerythematous swelling and tenderness on the lateral plantar aspect of the left foot.  Area appears well-healed from a small puncture wound.  No visible foreign bodies. Skin:    General: Skin is warm and dry.     Capillary Refill:  Capillary refill takes less than 2 seconds.  Neurological:     General: No focal deficit present.     Mental Status: She is alert.  Psychiatric:        Mood and Affect: Mood normal.     ED Results / Procedures / Treatments   Labs (all labs ordered are listed, but only abnormal results are displayed) Labs Reviewed - No data to display  EKG None  Radiology DG Foot Complete Left  Result Date: 11/27/2021 CLINICAL DATA:  Evaluate for retained piece of glass in foot. Patient reports pain and swelling after stepping on something. EXAM: LEFT FOOT - COMPLETE 3+ VIEW COMPARISON:  None. FINDINGS: There is diffuse soft tissue swelling. No signs of acute fracture or dislocation. No significant arthropathy. There is a tiny radiodensity within the subcutaneous soft  tissues along the plantar aspect of the foot. This measures approximately 2 mm. On the lateral projection radiograph this appears superficial to the base of fifth metatarsal bone. On the oblique radiograph this is noted adjacent to the base of the fifth metatarsal bone. IMPRESSION: 1. Tiny foreign body is noted within the subcutaneous soft tissues along the plantar aspect of the foot. 2. Diffuse soft tissue swelling. Electronically Signed   By: Signa Kell M.D.   On: 11/27/2021 12:03    Procedures Procedures    Medications Ordered in ED Medications - No data to display  ED Course/ Medical Decision Making/ A&P Clinical Course as of 11/27/21 1215  Sat Nov 27, 2021  1213 DG Foot Complete Left [EC]    Clinical Course User Index [EC] Janell Quiet, PA-C                           Medical Decision Making Amount and/or Complexity of Data Reviewed Radiology: ordered. Decision-making details documented in ED Course.   41 year old female presents to the ED for evaluation of suspected foreign body in her left foot.  Differentials include retained foreign body, cellulitis, ulcer.  Vitals without significant abnormality.  On exam, there is a small area of of swelling and tenderness to the lateral plantar aspect of the left foot.  There is a well-healed small puncture wound without any visible foreign bodies.  I ordered and interpreted x-ray of the foot which shows a small foreign body on the plantar aspect of the foot, superficial to the base of the fifth metatarsal.  I agree with radiologist interpretation.  Given location and clinical exam findings, it is not amenable to removal here in the emergency department.  Discussed with patient that we will likely work itself out over time or she may need to be seen by a podiatrist for better evaluation.  Podiatry referral provided.  I offered patient shoe for comfort but she declines.  Discussed RICE protocol.  She expresses understanding is amenable to  plan.  Discharged home in good condition. Final Clinical Impression(s) / ED Diagnoses Final diagnoses:  Foreign body in left foot, initial encounter    Rx / DC Orders ED Discharge Orders     None         Janell Quiet, PA-C 11/27/21 1222    Tanda Rockers A, DO 12/01/21 [phone]

## 2021-11-27 NOTE — Discharge Instructions (Signed)
Your x-ray shows a small piece of glass that has embedded itself within the bottom of your foot.  Unfortunately, given its location we are unable to remove that here in the emergency department.  I given you a podiatry referral that you can call and schedule an appointment with.

## 2021-11-29 ENCOUNTER — Encounter: Payer: Self-pay | Admitting: Podiatry

## 2021-11-29 ENCOUNTER — Ambulatory Visit (INDEPENDENT_AMBULATORY_CARE_PROVIDER_SITE_OTHER): Payer: Managed Care, Other (non HMO)

## 2021-11-29 ENCOUNTER — Ambulatory Visit: Payer: Managed Care, Other (non HMO) | Admitting: Podiatry

## 2021-11-29 ENCOUNTER — Telehealth: Payer: Self-pay | Admitting: *Deleted

## 2021-11-29 DIAGNOSIS — M79672 Pain in left foot: Secondary | ICD-10-CM

## 2021-11-29 DIAGNOSIS — M795 Residual foreign body in soft tissue: Secondary | ICD-10-CM | POA: Diagnosis not present

## 2021-11-29 DIAGNOSIS — L923 Foreign body granuloma of the skin and subcutaneous tissue: Secondary | ICD-10-CM

## 2021-11-29 NOTE — Progress Notes (Signed)
Subjective:   Patient ID: Bonnie Fisher, female   DOB: 41 y.o.   MRN: [id number]   HPI Patient presents stating she stepped on something left and its been very sore and hard to walk on and she has been to to urgent cares and they have not been able to help her.  States it feels like she is walking on some did have an x-ray but unsure and did indicate there was something there.  Patient does not smoke likes to be active   Review of Systems  All other systems reviewed and are negative.       Objective:  Physical Exam Vitals and nursing note reviewed.  Constitutional:      Appearance: She is well-developed.  Pulmonary:     Effort: Pulmonary effort is normal.  Musculoskeletal:        General: Normal range of motion.  Skin:    General: Skin is warm.  Neurological:     Mental Status: She is alert.     Neurovascular status intact muscle strength found to be adequate range of motion within normal limits.  Patient is noted to have a small area in the plantar left midfoot lateral side that is very painful when pressed there is some swelling noted proximal to this but I did not note any erythema and there was no drainage emitting from this there is no redness or warmth surrounding the area.  Good digital perfusion well oriented x3     Assessment:  Probability that this is a foreign body left with a piece of glass and I do not think there is infection but she is on precautionary antibiotic that I want her to take for the next 8 to 9 days until its finished and instructed on this possibility     Plan:  H&P x-rays reviewed and I went ahead today and I anesthetized the area with 120 mg Xylocaine and Marcaine and I then went ahead using 15 blade to open the area up just approximate 1 cm probe the area and found a small piece of glass that was removed entirely I did not note any drainage from the area I flushed it did not note any further pathological tissue and applied sterile dressing to the  area instructed to leave this on for several days and then to use Band-Aid over the area.  She will finish her antibiotics strict instructions given to call us if any issues were to occur  My view x-rays did not indicate a foreign body but I did see a small very discrete piece on the view taken at [facility]

## 2021-11-30 ENCOUNTER — Other Ambulatory Visit: Payer: Self-pay | Admitting: Podiatry

## 2021-11-30 DIAGNOSIS — M795 Residual foreign body in soft tissue: Secondary | ICD-10-CM

## 2022-03-17 NOTE — Telephone Encounter (Signed)
error
